# Patient Record
Sex: Female | Born: 1945
Health system: Southern US, Community
[De-identification: ages and names within clinical notes are randomized; demographics above are authoritative.]

## PROBLEM LIST (undated history)

## (undated) DIAGNOSIS — F329 Major depressive disorder, single episode, unspecified: Secondary | ICD-10-CM

## (undated) DIAGNOSIS — E079 Disorder of thyroid, unspecified: Secondary | ICD-10-CM

## (undated) DIAGNOSIS — C4491 Basal cell carcinoma of skin, unspecified: Secondary | ICD-10-CM

## (undated) DIAGNOSIS — Z780 Asymptomatic menopausal state: Secondary | ICD-10-CM

## (undated) DIAGNOSIS — F32A Depression, unspecified: Secondary | ICD-10-CM

## (undated) DIAGNOSIS — E785 Hyperlipidemia, unspecified: Secondary | ICD-10-CM

## (undated) HISTORY — DX: Asymptomatic menopausal state: Z78.0

## (undated) HISTORY — DX: Hyperlipidemia, unspecified: E78.5

## (undated) HISTORY — DX: Disorder of thyroid, unspecified: E07.9

## (undated) HISTORY — DX: Depression, unspecified: F32.A

## (undated) HISTORY — PX: ABDOMINAL HYSTERECTOMY: SHX81

## (undated) HISTORY — DX: Major depressive disorder, single episode, unspecified: F32.9

---
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
## 1999-11-08 ENCOUNTER — Encounter: Payer: Self-pay | Admitting: Family Medicine

## 1999-11-08 ENCOUNTER — Encounter: Admission: RE | Admit: 1999-11-08 | Discharge: 1999-11-08 | Payer: Self-pay | Admitting: Family Medicine

## 2000-11-08 ENCOUNTER — Encounter: Payer: Self-pay | Admitting: Family Medicine

## 2000-11-08 ENCOUNTER — Encounter: Admission: RE | Admit: 2000-11-08 | Discharge: 2000-11-08 | Payer: Self-pay | Admitting: Family Medicine

## 2001-11-26 ENCOUNTER — Encounter: Payer: Self-pay | Admitting: Family Medicine

## 2001-11-26 ENCOUNTER — Encounter: Admission: RE | Admit: 2001-11-26 | Discharge: 2001-11-26 | Payer: Self-pay | Admitting: Family Medicine

## 2001-11-28 ENCOUNTER — Encounter: Admission: RE | Admit: 2001-11-28 | Discharge: 2001-11-28 | Payer: Self-pay | Admitting: Family Medicine

## 2001-11-28 ENCOUNTER — Encounter: Payer: Self-pay | Admitting: Family Medicine

## 2002-12-15 ENCOUNTER — Encounter: Payer: Self-pay | Admitting: Family Medicine

## 2002-12-15 ENCOUNTER — Encounter: Admission: RE | Admit: 2002-12-15 | Discharge: 2002-12-15 | Payer: Self-pay | Admitting: Family Medicine

## 2003-12-22 ENCOUNTER — Encounter: Admission: RE | Admit: 2003-12-22 | Discharge: 2003-12-22 | Payer: Self-pay | Admitting: Physician Assistant

## 2005-01-13 ENCOUNTER — Encounter: Admission: RE | Admit: 2005-01-13 | Discharge: 2005-01-13 | Payer: Self-pay | Admitting: Physician Assistant

## 2006-01-15 ENCOUNTER — Encounter: Admission: RE | Admit: 2006-01-15 | Discharge: 2006-01-15 | Payer: Self-pay | Admitting: Family Medicine

## 2006-08-22 ENCOUNTER — Encounter: Admission: RE | Admit: 2006-08-22 | Discharge: 2006-08-22 | Payer: Self-pay | Admitting: Family Medicine

## 2007-01-18 ENCOUNTER — Encounter: Admission: RE | Admit: 2007-01-18 | Discharge: 2007-01-18 | Payer: Self-pay | Admitting: Family Medicine

## 2007-01-30 ENCOUNTER — Encounter: Payer: Self-pay | Admitting: Obstetrics & Gynecology

## 2007-01-30 ENCOUNTER — Ambulatory Visit: Payer: Self-pay | Admitting: Obstetrics & Gynecology

## 2007-02-01 ENCOUNTER — Ambulatory Visit: Payer: Self-pay | Admitting: Obstetrics & Gynecology

## 2007-02-22 ENCOUNTER — Ambulatory Visit: Payer: Self-pay | Admitting: Obstetrics & Gynecology

## 2007-10-03 ENCOUNTER — Encounter: Admission: RE | Admit: 2007-10-03 | Discharge: 2007-10-03 | Payer: Self-pay | Admitting: Obstetrics & Gynecology

## 2008-01-21 ENCOUNTER — Encounter: Admission: RE | Admit: 2008-01-21 | Discharge: 2008-01-21 | Payer: Self-pay | Admitting: Obstetrics & Gynecology

## 2008-02-12 ENCOUNTER — Ambulatory Visit: Payer: Self-pay | Admitting: Obstetrics & Gynecology

## 2008-02-12 ENCOUNTER — Encounter: Payer: Self-pay | Admitting: Obstetrics & Gynecology

## 2008-02-12 LAB — CONVERTED CEMR LAB
BUN: 14 mg/dL (ref 6–23)
CO2: 23 meq/L (ref 19–32)
Calcium: 9.4 mg/dL (ref 8.4–10.5)
Chloride: 102 meq/L (ref 96–112)
Cholesterol: 249 mg/dL — ABNORMAL HIGH (ref 0–200)
Creatinine, Ser: 0.64 mg/dL (ref 0.40–1.20)
Glucose, Bld: 91 mg/dL (ref 70–99)
HCT: 38.9 % (ref 36.0–46.0)
HDL: 53 mg/dL (ref >39)
Hemoglobin: 12.9 g/dL (ref 12.0–15.0)
LDL Cholesterol: 160 mg/dL — ABNORMAL HIGH (ref 0–99)
MCHC: 33.2 g/dL (ref 30.0–36.0)
MCV: 92.6 fL (ref 78.0–100.0)
Platelets: 266 10*3/uL (ref 150–400)
Potassium: 4.4 meq/L (ref 3.5–5.3)
RBC: 4.2 M/uL (ref 3.87–5.11)
RDW: 12.4 % (ref 11.5–15.5)
Sodium: 138 meq/L (ref 135–145)
TSH: 4.93 microintl units/mL — ABNORMAL HIGH (ref 0.350–4.50)
Total CHOL/HDL Ratio: 4.7
Triglycerides: 179 mg/dL — ABNORMAL HIGH (ref <150)
VLDL: 36 mg/dL (ref 0–40)
WBC: 5.5 10*3/uL (ref 4.0–10.5)

## 2008-02-24 ENCOUNTER — Ambulatory Visit: Payer: Self-pay | Admitting: Obstetrics & Gynecology

## 2009-03-02 ENCOUNTER — Encounter: Admission: RE | Admit: 2009-03-02 | Discharge: 2009-03-02 | Payer: Self-pay | Admitting: Surgery

## 2009-03-02 ENCOUNTER — Ambulatory Visit: Payer: Self-pay | Admitting: Obstetrics & Gynecology

## 2009-03-03 ENCOUNTER — Encounter: Payer: Self-pay | Admitting: Obstetrics & Gynecology

## 2009-03-03 LAB — CONVERTED CEMR LAB
Calcium: 9.6 mg/dL (ref 8.4–10.5)
Cholesterol: 230 mg/dL — ABNORMAL HIGH (ref 0–200)
LDL Cholesterol: 148 mg/dL — ABNORMAL HIGH (ref 0–99)
Potassium: 4.7 meq/L (ref 3.5–5.3)
Sodium: 137 meq/L (ref 135–145)
TSH: 2.493 microintl units/mL (ref 0.350–4.500)
Total CHOL/HDL Ratio: 3.9
VLDL: 23 mg/dL (ref 0–40)

## 2009-03-16 ENCOUNTER — Ambulatory Visit: Payer: Self-pay | Admitting: Family Medicine

## 2009-03-16 DIAGNOSIS — R42 Dizziness and giddiness: Secondary | ICD-10-CM

## 2010-03-15 ENCOUNTER — Encounter
Admission: RE | Admit: 2010-03-15 | Discharge: 2010-03-15 | Payer: Self-pay | Source: Home / Self Care | Attending: Obstetrics & Gynecology | Admitting: Obstetrics & Gynecology

## 2010-04-12 NOTE — Assessment & Plan Note (Signed)
Summary: V&D/TJ   Vital Signs:  Patient Profile:   65 Years Old Female CC:      Vomiting & diarrhea, dizzy x 2 days Height:     68 inches Weight:      272 pounds O2 Sat:      97 % O2 treatment:    Room Air Temp:     97.2 degrees F oral Pulse rate:   91 / minute Pulse rhythm:   regular Resp:     16 per minute BP sitting:   115 / 80  (right arm) Cuff size:   regular  Vitals Entered By: Emilio Math (March 16, 2009 3:20 PM)                  Current Allergies: ! PENICILLINHistory of Present Illness Chief Complaint: Vomiting & diarrhea, dizzy x 2 days History of Present Illness: Subjective:  Patient complains of developing nausea/vomiting and diarrhea yesterday which improved, then today she awoke with dizziness (sensation of being off-balance).  She had a headache yesterday, resolved.  No fevers, chills, and sweats.  No respiratory or GU symptoms.  No localizing neuro symptoms   Current Meds SYNTHROID 25 MCG TABS (LEVOTHYROXINE SODIUM)  BAYER CHILDRENS ASPIRIN 81 MG CHEW (ASPIRIN)  MECLIZINE HCL 25 MG TABS (MECLIZINE HCL) One by mouth two times a day to three times a day as needed for dizziness  REVIEW OF SYSTEMS Constitutional Symptoms      Denies fever, chills, night sweats, weight loss, weight gain, and fatigue.  Eyes       Denies change in vision, eye pain, eye discharge, glasses, contact lenses, and eye surgery. Ear/Nose/Throat/Mouth       Denies hearing loss/aids, change in hearing, ear pain, ear discharge, dizziness, frequent runny nose, frequent nose bleeds, sinus problems, sore throat, hoarseness, and tooth pain or bleeding.  Respiratory       Denies dry cough, productive cough, wheezing, shortness of breath, asthma, bronchitis, and emphysema/COPD.  Cardiovascular       Denies murmurs, chest pain, and tires easily with exhertion.    Gastrointestinal       Complains of stomach pain, nausea/vomiting, and diarrhea.      Denies constipation, blood in bowel  movements, and indigestion. Genitourniary       Denies painful urination, kidney stones, and loss of urinary control. Neurological       Complains of headaches and weakness.      Denies loss of or changes in sensation, numbness, tngling, tremors, paralysis, seizures, and fainting/blackouts. Musculoskeletal       Denies muscle pain, joint pain, joint stiffness, decreased range of motion, redness, swelling, muscle weakness, and gout.  Skin       Denies bruising, unusual mles/lumps or sores, and hair/skin or nail changes.  Psych       Denies mood changes, temper/anger issues, anxiety/stress, speech problems, depression, and sleep problems.  Past History:  Past Medical History: Unremarkable  Past Surgical History: Denies surgical history  Family History: Mother, D, Heart Father, D, Heart  Social History: Non-smoker ETOH-yes No [employer], [employer]   Objective: Appearance:  Patient appears uncomfortable but in no distress.  She is alert and oriented  Eyes:  Pupils are equal, round, and reactive to light and accomdation.  Extraocular movement is intact.  Conjunctivae are not inflamed.  Fundi benign Ears:  Canals normal.  Tympanic membranes normal.   Nose:  Normal septum.  Normal turbinates, mildly congested.   No sinus  tenderness present.  Mouth:  moist mucous membranes  Neck:  Supple.  No adenopathy is present.   Lungs:  Clear to auscultation.  Breath sounds are equal.  Heart:  Regular rate and rhythm without murmurs, rubs, or gallops.  Abdomen:  Nontender without masses or hepatosplenomegaly.  Bowel sounds are present.  No CVA or flank tenderness.  Neurologic:  Cranial nerves 2 through 12 are normal.  Patellar reflexes are normal.  Cerebellar function is intact.    Assessment New Problems: VERTIGO (ICD-780.4)  Suspect early symptoms of a viral illness  Plan New Medications/Changes: MECLIZINE HCL 25 MG TABS (MECLIZINE HCL) One by mouth two times a day to  three times a day as needed for dizziness  #15 x 1, 03/16/2009, Donna Christen MD  New Orders: Est. Patient Level III [04540] Promethazine up to 50mg  [J2550] Admin of Therapeutic Inj  intramuscular or subcutaneous [96372] Promethazine up to 50mg  [J2550] Planning Comments:   Promethazine 25mg  IM Clear liquids today, then advance.  Rx for meclizine If symptoms become significantly worse during the night  proceed to the local emergency room.  Follow-up with PCP if not improving 2 to 3 days.   The patient and/or caregiver has been counseled thoroughly with regard to medications prescribed including dosage, schedule, interactions, rationale for use, and possible side effects and they verbalize understanding.  Diagnoses and expected course of recovery discussed and will return if not improved as expected or if the condition worsens. Patient and/or caregiver verbalized understanding.  Prescriptions: MECLIZINE HCL 25 MG TABS (MECLIZINE HCL) One by mouth two times a day to three times a day as needed for dizziness  #15 x 1   Entered and Authorized by:   Donna Christen MD   Signed by:   Donna Christen MD on 03/16/2009   Method used:   Print then Give to Patient   RxID:   [id number]    Medication Administration  Injection # 1:    Medication: Promethazine up to 50mg     Diagnosis: VERTIGO (ICD-780.4)    Route: IM    Site: LUOQ gluteus    Exp Date: 02/10/2010    Lot #: [id number]    Mfr: Baxter    Comments: 25mg s given    Patient tolerated injection without complications    Given by: Emilio Math (March 16, 2009 4:08 PM)  Orders Added: 1)  Est. Patient Level III [46962] 2)  Promethazine up to 50mg  [J2550] 3)  Admin of Therapeutic Inj  intramuscular or subcutaneous [96372] 4)  Promethazine up to 50mg  [J2550]

## 2010-04-12 NOTE — Letter (Signed)
Summary: Handout Printed  Printed Handout:  - Clear Liquid Diet-Brief 

## 2010-06-13 ENCOUNTER — Other Ambulatory Visit: Payer: Self-pay

## 2010-06-13 ENCOUNTER — Ambulatory Visit: Payer: 59 | Admitting: Internal Medicine

## 2010-06-13 DIAGNOSIS — Z113 Encounter for screening for infections with a predominantly sexual mode of transmission: Secondary | ICD-10-CM

## 2010-06-13 DIAGNOSIS — E039 Hypothyroidism, unspecified: Secondary | ICD-10-CM

## 2010-06-13 DIAGNOSIS — R319 Hematuria, unspecified: Secondary | ICD-10-CM

## 2010-06-13 DIAGNOSIS — R4589 Other symptoms and signs involving emotional state: Secondary | ICD-10-CM

## 2010-06-13 DIAGNOSIS — Z1272 Encounter for screening for malignant neoplasm of vagina: Secondary | ICD-10-CM

## 2010-06-13 DIAGNOSIS — Z01419 Encounter for gynecological examination (general) (routine) without abnormal findings: Secondary | ICD-10-CM

## 2010-06-21 ENCOUNTER — Ambulatory Visit: Payer: 59 | Attending: Internal Medicine | Admitting: Physical Therapy

## 2010-07-13 ENCOUNTER — Ambulatory Visit: Payer: 59 | Admitting: Internal Medicine

## 2010-07-26 NOTE — Assessment & Plan Note (Signed)
NAMEJEMILA, [name] NO.:  [id number]   MEDICAL RECORD NO.:  [id number]          PATIENT TYPE:  POB   LOCATION:  [facility] [place]         FACILITY:  [facility]   PHYSICIAN:  Elsie Lincoln, MD      DATE OF BIRTH:  Sep 28, 1945   DATE OF SERVICE:                                  CLINIC NOTE   The patient is a 65 year old nulliparous female who presents for GYN  exam.  She had a hysterectomy in 1991 for fibroids.  She has never had  an abnormal Pap smear but desires Pap smears every year.  We will comply  with that today.  She did have her ovaries left behind but she has been  in menopause for probably about 10 years.  She had a bone mineral  density test about 10 years ago and would like another one.  She is  scheduled for a colonoscopy at the beginning of the year.  She has never  had one of these before.  She is also having fasting labs done this  Friday.  We are doing a urinalysis today because she does have leakage  of urine.  It is not severe, however, it does occur __________ to make  sure that she does not have a urinary tract infection.  She has never  had any children.  The patient is not sexually active.  She lives alone.   PAST MEDICAL HISTORY:  She has hypothyroidism.   PAST SURGICAL HISTORY:  Hysterectomy in 1991.   GYN HISTORY:  No abnormal Pap smears.  Positive history of fibroids  requiring hysterectomy.  No ovarian cysts.  No STDs.   OBSTETRICAL HISTORY:  G-0, P-0.   FAMILY HISTORY:  Mother and father had diabetes, heart attacks, heart  disease, high blood pressure.  Her mother has had some type of cancer in  the past but I cannot read on her intake sheet what type of cancer it  is.   MEDICATIONS:  Thyroid medication, Citalopram, and B12.   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   REVIEW OF SYSTEMS:  Positive for weight gain, a loss of smelling, but  she has seen the ENT for this.  She is  near-sighted.  Loss of urine with  coughing or sneezing and hot flashes.  The black cohosh she takes helps  her hot flashes.   ALLERGIES:  None to medications.   PHYSICAL EXAMINATION:  GENERAL:  Well-nourished, well-developed, no  apparent distress.  HEENT:  Normocephalic, atraumatic.  Good dentition.  NECK:  Thyroid:  No masses.  LUNGS:  Clear to auscultation bilaterally.  HEART:  Regular rate and rhythm.  BREASTS:  No masses, nontender, no lymphadenopathy, no nipple discharge.  ABDOMEN:  Soft, nontender.  No organomegaly, no hernia.  PELVIC: Genitalia Tanner  V.  Vagina atrophic.  Urethra:  No prolapse.  Bladder small cystocele.  Vagina good length.  Uterus surgically absent.  Adnexa:  No masses, nontender.  Rectovaginal:  No __________.  EXTREMITIES:  Nontender.   ASSESSMENT/PLAN:  A 65 year old female for Well Woman exam.  1. Pap smear.  2.  CBC, BNP, TSH, and cholesterol panel this Friday, fasting.  3. A mammogram was done last week.  Will follow up result.      Colonoscopy next year.  4. A mineral density.  5. Return to clinic in a year or sooner if labs are abnormal.           ______________________________  Elsie Lincoln, MD     KL/MEDQ  D:  01/30/2007  T:  01/30/2007  Job:  [id number]

## 2010-07-26 NOTE — Assessment & Plan Note (Signed)
[name], [name]                 ACCOUNT NO.:  [id number]   MEDICAL RECORD NO.:  [id number]          PATIENT TYPE:  POB   LOCATION:  [facility] [place]         [facility]ACILITY:  [facility]   PHYSICIAN:  Elsie Lincoln, MD      DATE OF BIRTH:  07/04/1945   DATE OF SERVICE:  03/02/2009                                  CLINIC NOTE   The patient is a 65 year old female who presents for yearly exam.  The  patient has no complaints.  She has hypothyroid and does sees an  endocrinologist which she does not particularly enjoy seeing.  She wants  to know we can manage it.  I told her as long as that she is easy to  manage, I could manage her thyroid medication.  She is on 25 mcg of  levothyroxine and the doctor has not checked her level recently.  I will  check TSH tomorrow when she does her fasting blood work.  She again does  want a Pap smear even though she  has had a hysterectomy for benign  reasons.  She understands that insurance at some point may not pay and  she said she will deal without it at that time.   PAST MEDICAL HISTORY:  Hypothyroidism.   PAST SURGICAL HISTORY:  Hysterectomy.   GYNECOLOGIC HISTORY:  No history of abnormal Pap smear, status post  hysterectomy.  No ovarian cyst.  No STDs.   OBSTETRICAL HISTORY:  Para 0.   FAMILY HISTORY:  Kidney cancer.   MEDICATIONS:  Citalopram, B12, multivitamin, flaxseed oil, calcium plus  vitamin D, aspirin, D3, black cohosh, levothyroxine, and fish oil.   ALLERGIES:  None.   REVIEW OF SYSTEMS:  Negative.   PHYSICAL EXAMINATION:  VITAL SIGNS:  Pulse 78, blood pressure 124/77,  weight 173, height 68 inches.  GENERAL:  Well nourished, well developed, no apparent distress.  HEENT:  Normocephalic, atraumatic.  Good dentition.  Thyroid, no masses.  LUNGS:  Clear to auscultation bilaterally.  HEART:  Regular rate and rhythm.  BREASTS:  No masses, nontender.  No lymphadenopathy.  No nipple  discharge.  ABDOMEN:  Soft, nontender.  No  organomegaly.  No hernia.  PELVIC:  Genitalia, Tanner V.  Vagina atrophic.  Vaginal cuff intact.  Bladder nontender.  Adnexa, no masses.  Uterus surgically absent.  RECTAL:  No hemorrhoids, rectovaginal masses.  EXTREMITIES:  Nontender.   ASSESSMENT AND PLAN:  A 65 year old well-woman for well-woman exam with  well-woman exam;  1. Pap smear.  2. Check TSH, basic metabolic panel and lipid panel.  The patient had      high cholesterol last year, but does not followup with her family      practice doctor.  We will check it again and refer her on if it is      still elevated.  The patient states she did follow low-cholesterol      diet.  3. The patient had normal bone density test last year.  We will not      need another one for several more years.  4. The patient had normal colonoscopy in 2009.  ______________________________  Elsie Lincoln, MD     KL/MEDQ  D:  03/02/2009  T:  03/03/2009  Job:  [id number]

## 2010-07-26 NOTE — Assessment & Plan Note (Signed)
NAMEMIIA, BLANKS                 ACCOUNT NO.:  [id number]   MEDICAL RECORD NO.:  [id number]          PATIENT TYPE:  POB   LOCATION:  [facility] [place]         FACILITY:  [facility]   PHYSICIAN:  Elsie Lincoln, MD      DATE OF BIRTH:  March 27, 1945   DATE OF SERVICE:                                  CLINIC NOTE   The patient is a 65 year old female who presents for her well-woman  exam.  The patient has no problems.  She is menopausal and has had a  hysterectomy 20 years ago.  Of note, her doctor took her off her thyroid  medicine, so TSH was 0.238 last year.  We are going to redraw it today.  The patient does self-breast exams, wears sunscreen, just got a  colonoscopy which was normal.  Her bone mineral density was normal.  She  exercises 3 times a week, does not smoke, drink, or do drugs although  occasional glass of wine.  The patient flosses twice a day.  The patient  has had a hysterectomy for benign reasons, but still desires to have a  Pap smear, we will do that today.   PAST MEDICAL HISTORY:  Hypothyroidism.   PAST SURGICAL HISTORY:  Hysterectomy in 1991.   GYN HISTORY:  No abnormal Pap smears, status post hysterectomy, no  ovarian cysts, no sexually transmitted diseases.   OBSTETRICAL HISTORY:  Para 0.   FAMILY HISTORY:  I did find out that her mother had kidney cancer and  died at age 73.   MEDICATIONS:  Citalopram, B12, multivitamin, flaxseed oil, calcium plus  vitamin D.   ALLERGIES:  None.   REVIEW OF SYSTEMS:  Negative.   PHYSICAL EXAMINATION:  VITAL SIGNS:  Pulse 76, blood pressure 124/80,  weight 182, height 68 inches.  GENERAL:  Well nourished, well developed in no apparent distress.  HEENT:  Normocephalic, atraumatic.  Thyroid no masses.  LUNGS:  Clear to auscultation bilaterally.  HEART:  Regular rate and rhythm.  BREASTS:  No masses.  No lymphadenopathy.  No nipple changes or so  discharge.  ABDOMEN:  Soft, nontender.  No organomegaly.  No hernia.  No  lymphadenopathy.  PELVIC:  Genitalia Tanner V.  Vagina appears atrophic, but not  symptomatic.  Vaginal cuff is intact. On bimanual, there were no masses  and nontender.  Rectovaginal no masses, no nodularity.  EXTREMITIES:  Nontender.  No edema.   ASSESSMENT AND PLAN:  A 65 year old well woman.  1. Pap smear.  2. Mammogram has already been done this year and is within normal      limits.  The patient needs a bone mineral density in 5 years,      colonoscopy in 10 years.  3. The patient has already received her influenza vaccine, but is      interested in the zoster vaccine which we will order for her.  The      patient should come back in a year, otherwise.           ______________________________  Elsie Lincoln, MD     KL/MEDQ  D:  02/12/2008  T:  02/13/2008  Job:  [id number]

## 2010-10-05 ENCOUNTER — Telehealth: Payer: Self-pay | Admitting: Internal Medicine

## 2010-10-05 NOTE — Telephone Encounter (Signed)
Pt would like a referral for Bone Density to be scheduled at [facility] in [place].  Pt will call to schedule Bone Density when referral is sent.  Please call when referral sent.

## 2010-10-05 NOTE — Telephone Encounter (Signed)
Per chart, looks like pt's last bone density was done 12/10.  OK to order repeat scan?

## 2010-10-06 NOTE — Telephone Encounter (Signed)
Call pt.  She is not due until 2 years since last exam.  If none in 12/10  Have her call back in December to schedule another

## 2010-10-06 NOTE — Telephone Encounter (Signed)
LMOVM for pt to return call to the ofc, need clarification on when pt had her last bone density- needs to be 2 years between scans

## 2010-10-07 ENCOUNTER — Encounter: Payer: Self-pay | Admitting: Emergency Medicine

## 2010-10-14 ENCOUNTER — Telehealth: Payer: Self-pay | Admitting: Internal Medicine

## 2010-10-14 NOTE — Telephone Encounter (Signed)
Pt called about lab urine culture done 06/13/2010 at CPE visit.  She received a bill for $7.90 from [employer].  Stated it should be covered under her wellness coverage.  Stated coding should be changed to wellness exam.  Also

## 2010-10-17 ENCOUNTER — Telehealth: Payer: Self-pay | Admitting: Emergency Medicine

## 2010-10-17 DIAGNOSIS — Z13820 Encounter for screening for osteoporosis: Secondary | ICD-10-CM

## 2010-10-17 DIAGNOSIS — Z780 Asymptomatic menopausal state: Secondary | ICD-10-CM

## 2010-10-17 NOTE — Telephone Encounter (Signed)
Pt called Friday 10/14/10 with question regarding balance billed as a result of urine culture done 4/12.  LMOVM at home phone number to try to answer questions-dhp, rn

## 2010-10-24 NOTE — Telephone Encounter (Signed)
Spoke with pt.  She states that she has received two bills from [facility].  She states she is not required to pay anything for wellness labs.  One bill was for urine culture for $7.90.  I explained that urine culture would not fall under wellness because she was found to have blood in her urine on dipstick in the office, that is why culture was sent.  She is agreeable to pay that bill.  Other bill was for labs run on same day.  She states bill is for $71.13.  Labs were not filled with wellness diagnosis, so they did not fall under wellness benefit of her insurance plan.  Can this be adjusted?  Also, her last bone density scan was done in 09/2007.  She would like to have repeated.  Okay to order at [facility] in [place]?  Will call patient with dates of last tetanus and zoster immunizations given at [facility]

## 2010-10-24 NOTE — Telephone Encounter (Signed)
OK to charge for preventive visit for labs  thanks

## 2010-10-24 NOTE — Telephone Encounter (Signed)
LMOVM for nurse for Primus Bravo (NP) at [facility] to get date of vaccination for tetanus and zoster.  Order entered for [facility] for bone density. Pt aware she can call at her convenience to schd (her request)  Spoke with Ebony in billing at [facility].  She states at this time, pt does not have a balance.  Claim from 06/13/10 was refiled Friday 10/21/10 to [employer].  Will await their reply before adding anything additional to claim.  If needed, can add code v72.31 to claim so that labs will fall under wellness benefit.  Pt is aware, per Karel Jarvis, she has $0 balance and does not need to pay either of her current bills.  She will call me if I need to add v- code to labs.

## 2010-11-02 NOTE — Telephone Encounter (Signed)
Spoke with Thurston Hole at [facility]

## 2010-11-02 NOTE — Telephone Encounter (Signed)
Spoke with Thurston Hole at [facility].  Per Thurston Hole- pt had Td in 2006, no record of zoster vaccine.  LMOVM for pt with same information.

## 2011-01-11 ENCOUNTER — Other Ambulatory Visit: Payer: 59

## 2011-02-07 ENCOUNTER — Ambulatory Visit
Admission: RE | Admit: 2011-02-07 | Discharge: 2011-02-07 | Disposition: A | Discharge status: Home / Self Care | Payer: 59 | Source: Ambulatory Visit | Attending: Internal Medicine | Admitting: Internal Medicine

## 2011-02-16 ENCOUNTER — Encounter: Payer: Self-pay | Admitting: Emergency Medicine

## 2011-06-20 ENCOUNTER — Telehealth: Payer: Self-pay | Admitting: Internal Medicine

## 2011-06-20 ENCOUNTER — Encounter: Payer: Self-pay | Admitting: Internal Medicine

## 2011-06-20 DIAGNOSIS — F419 Anxiety disorder, unspecified: Secondary | ICD-10-CM | POA: Insufficient documentation

## 2011-06-20 DIAGNOSIS — E039 Hypothyroidism, unspecified: Secondary | ICD-10-CM | POA: Insufficient documentation

## 2011-06-20 DIAGNOSIS — E785 Hyperlipidemia, unspecified: Secondary | ICD-10-CM | POA: Insufficient documentation

## 2011-06-20 NOTE — Telephone Encounter (Signed)
Left message on voicemail for pt to return call to the office to schedule appt

## 2011-06-20 NOTE — Telephone Encounter (Signed)
[name],  Call this pt and let her know lthat I will refill 30 days worth of meds.  She has not been seen in a year and was supposed to follow up with me one month after her CPE  Which was on 06/13/2010  I haven't seen her since.    Give her an appt an d message back with appt date  Thanks

## 2011-06-22 ENCOUNTER — Encounter: Payer: Self-pay | Admitting: Emergency Medicine

## 2011-06-22 NOTE — Telephone Encounter (Signed)
I left pt a message, I have not heard back from her, so I mailed her a letter today

## 2011-07-11 ENCOUNTER — Other Ambulatory Visit: Payer: Self-pay | Admitting: Obstetrics and Gynecology

## 2011-07-11 DIAGNOSIS — Z1231 Encounter for screening mammogram for malignant neoplasm of breast: Secondary | ICD-10-CM

## 2011-07-14 ENCOUNTER — Other Ambulatory Visit: Payer: Self-pay | Admitting: Internal Medicine

## 2011-07-27 ENCOUNTER — Other Ambulatory Visit: Payer: Self-pay | Admitting: Internal Medicine

## 2011-07-27 NOTE — Telephone Encounter (Signed)
[name]    Call pt and  Let her know that I sent a one month supply to her pharmacy for her thyroid.  She has not seen me since 06/2010.  She needs to schedule office visit and have her thyroid blood levels checked once a year.  Ok to give appt

## 2011-07-31 ENCOUNTER — Encounter: Payer: Self-pay | Admitting: Internal Medicine

## 2011-07-31 ENCOUNTER — Other Ambulatory Visit: Payer: Self-pay | Admitting: Internal Medicine

## 2011-07-31 ENCOUNTER — Ambulatory Visit (INDEPENDENT_AMBULATORY_CARE_PROVIDER_SITE_OTHER): Payer: 59 | Admitting: Internal Medicine

## 2011-07-31 VITALS — BP 128/80 | HR 65 | Temp 98.5°F | Ht 67.25 in | Wt 181.0 lb

## 2011-07-31 DIAGNOSIS — R319 Hematuria, unspecified: Secondary | ICD-10-CM

## 2011-07-31 DIAGNOSIS — E039 Hypothyroidism, unspecified: Secondary | ICD-10-CM

## 2011-07-31 DIAGNOSIS — Z8659 Personal history of other mental and behavioral disorders: Secondary | ICD-10-CM | POA: Insufficient documentation

## 2011-07-31 DIAGNOSIS — Z90710 Acquired absence of both cervix and uterus: Secondary | ICD-10-CM

## 2011-07-31 DIAGNOSIS — E785 Hyperlipidemia, unspecified: Secondary | ICD-10-CM

## 2011-07-31 DIAGNOSIS — N63 Unspecified lump in unspecified breast: Secondary | ICD-10-CM

## 2011-07-31 DIAGNOSIS — Z124 Encounter for screening for malignant neoplasm of cervix: Secondary | ICD-10-CM

## 2011-07-31 DIAGNOSIS — Z0000 Encounter for general adult medical examination without abnormal findings: Secondary | ICD-10-CM

## 2011-07-31 HISTORY — DX: Acquired absence of both cervix and uterus: Z90.710

## 2011-07-31 LAB — COMPREHENSIVE METABOLIC PANEL
ALT: 15 U/L (ref 0–35)
BUN: 13 mg/dL (ref 6–23)
Calcium: 9.5 mg/dL (ref 8.4–10.5)
Glucose, Bld: 88 mg/dL (ref 70–99)
Potassium: 4.8 mEq/L (ref 3.5–5.3)

## 2011-07-31 LAB — POCT URINALYSIS DIPSTICK
Ketones, UA: NEGATIVE
Nitrite, UA: NEGATIVE
Protein, UA: NEGATIVE
Spec Grav, UA: >=1.03
pH, UA: 6.5

## 2011-07-31 LAB — CBC WITH DIFFERENTIAL/PLATELET
Basophils Absolute: 0 10*3/uL (ref 0.0–0.1)
Eosinophils Absolute: 0.2 10*3/uL (ref 0.0–0.7)
HCT: 39 % (ref 36.0–46.0)
Hemoglobin: 12.9 g/dL (ref 12.0–15.0)
Lymphocytes Relative: 35 % (ref 12–46)
MCH: 30.4 pg (ref 26.0–34.0)
MCHC: 33.1 g/dL (ref 30.0–36.0)
Neutrophils Relative %: 54 % (ref 43–77)
Platelets: 251 10*3/uL (ref 150–400)
WBC: 5.3 10*3/uL (ref 4.0–10.5)

## 2011-07-31 LAB — TSH: TSH: 2.498 u[IU]/mL (ref 0.350–4.500)

## 2011-07-31 LAB — LIPID PANEL
HDL: 51 mg/dL (ref >39)
LDL Cholesterol: 121 mg/dL — ABNORMAL HIGH (ref 0–99)
VLDL: 22 mg/dL (ref 0–40)

## 2011-07-31 MED ORDER — LEVOTHYROXINE SODIUM 25 MCG PO TABS: 25.0000 ug | ORAL_TABLET | Freq: Every day | ORAL | Status: DC

## 2011-07-31 MED ORDER — CITALOPRAM HYDROBROMIDE 20 MG PO TABS: 20.0000 mg | ORAL_TABLET | Freq: Every day | ORAL | Status: DC

## 2011-07-31 MED ORDER — CIPROFLOXACIN HCL 500 MG PO TABS: 500.0000 mg | ORAL_TABLET | Freq: Two times a day (BID) | ORAL | Status: AC

## 2011-07-31 NOTE — Patient Instructions (Signed)
Take antibiotic as prescribed  See me in 2-3 weeks for repeat urinalysis    Labs will be mailed to you

## 2011-07-31 NOTE — Progress Notes (Signed)
Subjective:    Patient ID: [name], female    DOB: 1945-03-24, 66 y.o.   MRN: [id number]  HPI Pt is here for comprehensive eval.  She is doing well.  She denies any dizziness.  No depressed mood Celexa controlling well.  See urine  She is asymptomatic.    She is up to date on vaccines and colonoscopy.  She has mammogram scheduled for next week     Allergies  Allergen Reactions  . Penicillins    Past Medical History  Diagnosis Date  . Thyroid disease     hypothyroid  . Depression   . Hyperlipidemia   . Menopause    Past Surgical History  Procedure Date  . Abdominal hysterectomy    History   Social History  . Marital Status: Divorced    Spouse Name: N/A    Number of Children: N/A  . Years of Education: N/A   Occupational History  . Not on file.   Social History Main Topics  . Smoking status: Never Smoker   . Smokeless tobacco: Never Used  . Alcohol Use: 0.5 oz/week    1 drink(s) per week  . Drug Use: No  . Sexually Active: Not Currently   Other Topics Concern  . Not on file   Social History Narrative  . No narrative on file   Family History  Problem Relation Age of Onset  . Heart disease Mother   . Diabetes Mother   . Heart disease Father    Patient Active Problem List  Diagnoses  . VERTIGO  . Hypothyroidism  . Hyperlipidemia  . Anxiety  . History of depression  . H/O: hysterectomy   Current Outpatient Prescriptions on File Prior to Visit  Medication Sig Dispense Refill  . aspirin 81 MG EC tablet Take 81 mg by mouth daily.        . Calcium Carbonate-Vit D-Min (CALCIUM 1200 PO) Take 1 tablet by mouth daily.        . cholecalciferol (VITAMIN D) 1000 UNITS tablet Take 1,000 Units by mouth daily.        . citalopram (CELEXA) 20 MG tablet TAKE ONE AND ONE-HALF TABLETS BY MOUTH DAILY  135 tablet  0  . levothyroxine (SYNTHROID, LEVOTHROID) 25 MCG tablet TAKE ONE TABLET BY MOUTH ONCE DAILY  30 tablet  0       Review of Systems    Objective:   Physical Exam Physical Exam  Nursing note and vitals reviewed.  Constitutional: She is oriented to person, place, and time. She appears well-developed and well-nourished.  HENT:  Head: Normocephalic and atraumatic.  Right Ear: Tympanic membrane and ear canal normal. No drainage. Tympanic membrane is not injected and not erythematous.  Left Ear: Tympanic membrane and ear canal normal. No drainage. Tympanic membrane is not injected and not erythematous.  Nose: Nose normal. Right sinus exhibits no maxillary sinus tenderness and no frontal sinus tenderness. Left sinus exhibits no maxillary sinus tenderness and no frontal sinus tenderness.  Mouth/Throat: Oropharynx is clear and moist. No oral lesions. No oropharyngeal exudate.  Eyes: Conjunctivae and EOM are normal. Pupils are equal, round, and reactive to light.  Neck: Normal range of motion. Neck supple. No JVD present. Carotid bruit is not present. No mass and no thyromegaly present.  Cardiovascular: Normal rate, regular rhythm, S1 normal, S2 normal and intact distal pulses. Exam reveals no gallop and no friction rub.  No murmur heard.  Pulses:  Carotid pulses are 2+ on the right  side, and 2+ on the left side.  Dorsalis pedis pulses are 2+ on the right side, and 2+ on the left side.  No carotid bruit. No LE edema  Pulmonary/Chest: Breath sounds normal. She has no wheezes. She has no rales. She exhibits no tenderness. Breast exam:  R no nipple discharge no discrete mass no axillary adenopathy bilaterally.  L I feel fullness at 7'0cclock.  No firm mass no nipple discharge no axillary adenopathy Abdominal: Soft. Bowel sounds are normal. She exhibits no distension and no mass. There is no hepatosplenomegaly. There is no tenderness. There is no CVA tenderness.  Musculoskeletal: Normal range of motion.  No active synovitis to joints.  Lymphadenopathy:  She has no cervical adenopathy.  She has no axillary adenopathy.  Right: No inguinal and no  supraclavicular adenopathy present.  Left: No inguinal and no supraclavicular adenopathy present.  Neurological: She is alert and oriented to person, place, and time. She has normal strength and normal reflexes. She displays no tremor. No cranial nerve deficit or sensory deficit. Coordination and gait normal.  Skin: Skin is warm and dry. No rash noted. No cyanosis. Nails show no clubbing.  Psychiatric: She has a normal mood and affect. Her speech is normal and behavior is normal. Cognition and memory are normal.       Assessment & Plan:  1)  Health Maintenance  See scanned sheet.  Due for Dexa next year.  Continue calcium vitamin D. Pap of vaginal cuff today 2)  Osteopenia  See above 3)  L breast fullness  Will get diagnositc mammogram.  Pt requests [facility] 4)  Hematuria and WBC's Will emprirically treat with Cipro  Pt counseled to be sure to return in 3 weeks for follow up urinalysis and that I want to be sure that blood had cleared  .  She voices understanding 4)  Hypothyroidism  Check today 5)  Hyperlipidemia will check today  Continue fish oil

## 2011-08-01 ENCOUNTER — Encounter: Payer: Self-pay | Admitting: *Deleted

## 2011-08-01 LAB — VITAMIN D 25 HYDROXY (VIT D DEFICIENCY, FRACTURES): Vit D, 25-Hydroxy: 46 ng/mL (ref 30–89)

## 2011-08-03 ENCOUNTER — Other Ambulatory Visit: Payer: Self-pay | Admitting: Internal Medicine

## 2011-08-03 ENCOUNTER — Ambulatory Visit
Admission: RE | Admit: 2011-08-03 | Discharge: 2011-08-03 | Disposition: A | Discharge status: Home / Self Care | Payer: 59 | Source: Ambulatory Visit | Attending: Internal Medicine | Admitting: Internal Medicine

## 2011-08-03 DIAGNOSIS — N63 Unspecified lump in unspecified breast: Secondary | ICD-10-CM

## 2011-08-04 LAB — URINE CULTURE: Colony Count: >=100000

## 2011-08-09 ENCOUNTER — Encounter: Payer: Self-pay | Admitting: *Deleted

## 2011-08-09 NOTE — Progress Notes (Signed)
Letter with mammogram results mailed to pt's home address.

## 2011-08-22 ENCOUNTER — Ambulatory Visit: Payer: 59

## 2011-08-24 ENCOUNTER — Telehealth: Payer: Self-pay | Admitting: *Deleted

## 2011-08-24 DIAGNOSIS — N390 Urinary tract infection, site not specified: Secondary | ICD-10-CM

## 2011-08-24 NOTE — Telephone Encounter (Signed)
Pt called wanting results of her urinalysis that she dropped off last week.  Urine culture was not done and pt is going to drop urine off in [employer] tomorrow and it will be sent for culture with results sent to Dr Constance Goltz.

## 2011-08-24 NOTE — Telephone Encounter (Signed)
See notes

## 2011-08-25 ENCOUNTER — Telehealth: Payer: Self-pay | Admitting: *Deleted

## 2011-08-25 DIAGNOSIS — N390 Urinary tract infection, site not specified: Secondary | ICD-10-CM

## 2011-08-25 NOTE — Telephone Encounter (Signed)
Pt to stop by office today for a urinalysis to f/u from the UTI she had.

## 2011-08-26 LAB — URINALYSIS, MICROSCOPIC ONLY
Bacteria, UA: NONE SEEN
Crystals: NONE SEEN

## 2011-08-26 LAB — URINALYSIS, ROUTINE W REFLEX MICROSCOPIC
Ketones, ur: NEGATIVE mg/dL
Nitrite: NEGATIVE
Specific Gravity, Urine: <1.005 — ABNORMAL LOW (ref 1.005–1.030)
Urobilinogen, UA: 0.2 mg/dL (ref 0.0–1.0)

## 2011-08-28 ENCOUNTER — Telehealth: Payer: Self-pay | Admitting: *Deleted

## 2011-08-28 NOTE — Telephone Encounter (Signed)
Pt notified that urine culture was insignificant growth.  Informed her that if she should have any further problems to call the office.

## 2011-08-30 ENCOUNTER — Telehealth: Payer: Self-pay | Admitting: *Deleted

## 2011-08-30 NOTE — Telephone Encounter (Signed)
Pt notified of repeat UA was normal and that her mammogram should be repeated in 1 year.

## 2011-09-13 ENCOUNTER — Telehealth: Payer: Self-pay | Admitting: Internal Medicine

## 2011-09-13 DIAGNOSIS — R87811 Vaginal high risk human papillomavirus (HPV) DNA test positive: Secondary | ICD-10-CM

## 2011-09-13 NOTE — Telephone Encounter (Signed)
Spoke with pt and informed of pos HPV with negative cytology.  Counseled to be sure to get pap smear every year. She voices understanding

## 2012-01-22 ENCOUNTER — Other Ambulatory Visit: Payer: Self-pay | Admitting: *Deleted

## 2012-01-22 MED ORDER — LEVOTHYROXINE SODIUM 25 MCG PO TABS: 25.0000 ug | ORAL_TABLET | Freq: Every day | ORAL | Status: DC

## 2012-01-22 MED ORDER — CITALOPRAM HYDROBROMIDE 20 MG PO TABS: 20.0000 mg | ORAL_TABLET | Freq: Every day | ORAL | Status: DC

## 2012-01-22 NOTE — Telephone Encounter (Signed)
This pt called me for the RF thinking I was working with you.  She gets them filled at [facility] in [place].   Thanks, Your 2nd favorite nurse.Marland KitchenMarland Kitchen

## 2012-07-15 ENCOUNTER — Telehealth: Payer: Self-pay | Admitting: *Deleted

## 2012-07-15 DIAGNOSIS — Z139 Encounter for screening, unspecified: Secondary | ICD-10-CM

## 2012-07-15 DIAGNOSIS — Z90710 Acquired absence of both cervix and uterus: Secondary | ICD-10-CM

## 2012-07-15 DIAGNOSIS — R87811 Vaginal high risk human papillomavirus (HPV) DNA test positive: Secondary | ICD-10-CM

## 2012-07-15 DIAGNOSIS — E039 Hypothyroidism, unspecified: Secondary | ICD-10-CM | POA: Diagnosis not present

## 2012-07-15 DIAGNOSIS — E559 Vitamin D deficiency, unspecified: Secondary | ICD-10-CM | POA: Diagnosis not present

## 2012-07-15 DIAGNOSIS — E785 Hyperlipidemia, unspecified: Secondary | ICD-10-CM

## 2012-07-24 NOTE — Telephone Encounter (Signed)
labs

## 2012-08-05 ENCOUNTER — Other Ambulatory Visit: Payer: Self-pay | Admitting: Internal Medicine

## 2012-08-12 ENCOUNTER — Telehealth: Payer: Self-pay | Admitting: *Deleted

## 2012-08-12 MED ORDER — LEVOTHYROXINE SODIUM 25 MCG PO TABS: 25.0000 ug | ORAL_TABLET | Freq: Every day | ORAL | Status: DC

## 2012-08-12 NOTE — Telephone Encounter (Signed)
[name] called and needs a refill of Levothyroxine called into her pharmacy.  She is completely out; pharmacy gave her 2 pills. Her pharmacy needs to be changed in [place]; it needs to be [facility] in [place].

## 2012-08-12 NOTE — Telephone Encounter (Signed)
Refill request

## 2012-09-02 ENCOUNTER — Telehealth: Payer: Self-pay | Admitting: *Deleted

## 2012-09-02 NOTE — Telephone Encounter (Signed)
[name] had her CPE on 07/31/11, but she received a bill for $71.  Insurance company said that visit was not coded as a CPE, and that we need to fix code and file again.  Please call patient and let her know that this has been done.

## 2012-09-10 ENCOUNTER — Telehealth: Payer: Self-pay | Admitting: *Deleted

## 2012-09-10 NOTE — Telephone Encounter (Signed)
This charge has been corrected and resubmitted/ms

## 2012-09-10 NOTE — Telephone Encounter (Signed)
Returned pt call notifying her that billing issue had been resolved

## 2012-09-10 NOTE — Telephone Encounter (Signed)
See Heather's note 

## 2012-09-24 ENCOUNTER — Ambulatory Visit (INDEPENDENT_AMBULATORY_CARE_PROVIDER_SITE_OTHER): Payer: Medicare Other

## 2012-09-24 DIAGNOSIS — Z1231 Encounter for screening mammogram for malignant neoplasm of breast: Secondary | ICD-10-CM

## 2012-09-24 DIAGNOSIS — E039 Hypothyroidism, unspecified: Secondary | ICD-10-CM | POA: Diagnosis not present

## 2012-09-25 ENCOUNTER — Encounter: Payer: Self-pay | Admitting: *Deleted

## 2012-09-25 LAB — COMPREHENSIVE METABOLIC PANEL
Albumin: 4.5 g/dL (ref 3.5–5.2)
CO2: 28 mEq/L (ref 19–32)
Calcium: 9.7 mg/dL (ref 8.4–10.5)
Glucose, Bld: 84 mg/dL (ref 70–99)
Potassium: 4.5 mEq/L (ref 3.5–5.3)
Sodium: 141 mEq/L (ref 135–145)
Total Bilirubin: 0.9 mg/dL (ref 0.3–1.2)
Total Protein: 7 g/dL (ref 6.0–8.3)

## 2012-09-25 LAB — CBC WITH DIFFERENTIAL/PLATELET
Eosinophils Absolute: 0.2 10*3/uL (ref 0.0–0.7)
Hemoglobin: 13.4 g/dL (ref 12.0–15.0)
Lymphs Abs: 2.5 10*3/uL (ref 0.7–4.0)
MCH: 30.2 pg (ref 26.0–34.0)
Monocytes Relative: 5 % (ref 3–12)
Neutrophils Relative %: 47 % (ref 43–77)
RBC: 4.44 MIL/uL (ref 3.87–5.11)

## 2012-09-25 LAB — LIPID PANEL: Cholesterol: 242 mg/dL — ABNORMAL HIGH (ref 0–200)

## 2012-09-25 LAB — VITAMIN D 25 HYDROXY (VIT D DEFICIENCY, FRACTURES): Vit D, 25-Hydroxy: 55 ng/mL (ref 30–89)

## 2012-10-10 ENCOUNTER — Ambulatory Visit (INDEPENDENT_AMBULATORY_CARE_PROVIDER_SITE_OTHER): Payer: Medicare Other | Admitting: Internal Medicine

## 2012-10-10 ENCOUNTER — Telehealth: Payer: Self-pay | Admitting: *Deleted

## 2012-10-10 ENCOUNTER — Encounter: Payer: 59 | Admitting: Internal Medicine

## 2012-10-10 ENCOUNTER — Encounter: Payer: Self-pay | Admitting: Internal Medicine

## 2012-10-10 ENCOUNTER — Ambulatory Visit: Payer: 59 | Admitting: Internal Medicine

## 2012-10-10 ENCOUNTER — Ambulatory Visit (HOSPITAL_BASED_OUTPATIENT_CLINIC_OR_DEPARTMENT_OTHER)
Admission: RE | Admit: 2012-10-10 | Discharge: 2012-10-10 | Disposition: A | Discharge status: Home / Self Care | Payer: Medicare Other | Source: Ambulatory Visit | Attending: Internal Medicine | Admitting: Internal Medicine

## 2012-10-10 VITALS — BP 120/70 | HR 76 | Temp 97.2°F | Resp 18 | Wt 185.0 lb

## 2012-10-10 DIAGNOSIS — T85528A Displacement of other gastrointestinal prosthetic devices, implants and grafts, initial encounter: Secondary | ICD-10-CM

## 2012-10-10 DIAGNOSIS — K9413 Enterostomy malfunction: Secondary | ICD-10-CM

## 2012-10-10 DIAGNOSIS — F419 Anxiety disorder, unspecified: Secondary | ICD-10-CM

## 2012-10-10 DIAGNOSIS — M19039 Primary osteoarthritis, unspecified wrist: Secondary | ICD-10-CM | POA: Diagnosis not present

## 2012-10-10 DIAGNOSIS — M79609 Pain in unspecified limb: Secondary | ICD-10-CM | POA: Insufficient documentation

## 2012-10-10 DIAGNOSIS — S6992XA Unspecified injury of left wrist, hand and finger(s), initial encounter: Secondary | ICD-10-CM

## 2012-10-10 DIAGNOSIS — S6990XA Unspecified injury of unspecified wrist, hand and finger(s), initial encounter: Secondary | ICD-10-CM

## 2012-10-10 DIAGNOSIS — Z23 Encounter for immunization: Secondary | ICD-10-CM | POA: Diagnosis not present

## 2012-10-10 DIAGNOSIS — Z0000 Encounter for general adult medical examination without abnormal findings: Secondary | ICD-10-CM | POA: Diagnosis not present

## 2012-10-10 DIAGNOSIS — E039 Hypothyroidism, unspecified: Secondary | ICD-10-CM

## 2012-10-10 DIAGNOSIS — Z1272 Encounter for screening for malignant neoplasm of vagina: Secondary | ICD-10-CM | POA: Diagnosis not present

## 2012-10-10 DIAGNOSIS — F411 Generalized anxiety disorder: Secondary | ICD-10-CM | POA: Diagnosis not present

## 2012-10-10 DIAGNOSIS — Z139 Encounter for screening, unspecified: Secondary | ICD-10-CM

## 2012-10-10 DIAGNOSIS — E785 Hyperlipidemia, unspecified: Secondary | ICD-10-CM | POA: Diagnosis not present

## 2012-10-10 DIAGNOSIS — R87811 Vaginal high risk human papillomavirus (HPV) DNA test positive: Secondary | ICD-10-CM

## 2012-10-10 DIAGNOSIS — K9403 Colostomy malfunction: Secondary | ICD-10-CM

## 2012-10-10 NOTE — Progress Notes (Signed)
Subjective:    Patient ID: [name], female    DOB: [birth year], 67 y.o.   MRN: [id number]  HPI  Liborio Nixon is here for CPE  See pap.  Her vaginal smear in 2012 was neg for HPV,  HPV positive in 2013.  She is S/P hysterectomy and tells me she has not been sexually active for years.    See lipids.  She repeartedly states she does not want any prescription meds for this  She fell several months ago and landed on L wrist.  She did not seek medical attention at that time but still has some residual swelling.  She is wondering if she fractured anything  Allergies  Allergen Reactions  . Penicillins    Past Medical History  Diagnosis Date  . Thyroid disease     hypothyroid  . Depression   . Hyperlipidemia   . Menopause    Past Surgical History  Procedure Laterality Date  . Abdominal hysterectomy     History   Social History  . Marital Status: Divorced    Spouse Name: N/A    Number of Children: N/A  . Years of Education: N/A   Occupational History  . Not on file.   Social History Main Topics  . Smoking status: Never Smoker   . Smokeless tobacco: Never Used  . Alcohol Use: 0.5 oz/week    1 drink(s) per week  . Drug Use: No  . Sexually Active: Not Currently   Other Topics Concern  . Not on file   Social History Narrative  . No narrative on file   Family History  Problem Relation Age of Onset  . Heart disease Mother   . Diabetes Mother   . Heart disease Father    Patient Active Problem List   Diagnosis Date Noted  . Vaginal high risk HPV DNA test positive 09/13/2011  . History of depression 07/31/2011  . H/O: hysterectomy 07/31/2011  . Hypothyroidism 06/20/2011  . Hyperlipidemia 06/20/2011  . Anxiety 06/20/2011  . VERTIGO 03/16/2009   Current Outpatient Prescriptions on File Prior to Visit  Medication Sig Dispense Refill  . aspirin 81 MG EC tablet Take 81 mg by mouth daily.        . Calcium Carbonate-Vit D-Min (CALCIUM 1200 PO) Take 1 tablet by mouth  daily.        . cholecalciferol (VITAMIN D) 1000 UNITS tablet Take 1,000 Units by mouth daily.        . citalopram (CELEXA) 20 MG tablet Take 1 tablet (20 mg total) by mouth daily.  135 tablet  1  . fish oil-omega-3 fatty acids 1000 MG capsule Take 1 capsule by mouth daily.      . Flaxseed, Linseed, (FLAX SEED OIL) 1000 MG CAPS Take 1 capsule by mouth daily.      Marland Kitchen GARLIC PO Take 1 capsule by mouth.      . levothyroxine (SYNTHROID, LEVOTHROID) 25 MCG tablet Take 1 tablet (25 mcg total) by mouth daily.  90 tablet  0  . Multiple Vitamin (MULTIVITAMIN) tablet Take 1 tablet by mouth daily.       No current facility-administered medications on file prior to visit.     Review of Systems See HPI    Objective:   Physical Exam  Physical Exam  Vital signs and nursing note reviewed  Constitutional: She is oriented to person, place, and time. She appears well-developed and well-nourished. She is cooperative.  HENT:  Head: Normocephalic and atraumatic.  Right Ear:  Tympanic membrane normal.  Left Ear: Tympanic membrane normal.  Nose: Nose normal.  Mouth/Throat: Oropharynx is clear and moist and mucous membranes are normal. No oropharyngeal exudate or posterior oropharyngeal erythema.  Eyes: Conjunctivae and EOM are normal. Pupils are equal, round, and reactive to light.  Neck: Neck supple. No JVD present. Carotid bruit is not present. No mass and no thyromegaly present.  Cardiovascular: Regular rhythm, normal heart sounds, intact distal pulses and normal pulses.  Exam reveals no gallop and no friction rub.   No murmur heard. Pulses:      Dorsalis pedis pulses are 2+ on the right side, and 2+ on the left side.  Pulmonary/Chest: Breath sounds normal. She has no wheezes. She has no rhonchi. She has no rales. Right breast exhibits no mass, no nipple discharge and no skin change. Left breast exhibits no mass, no nipple discharge and no skin change.  Abdominal: Soft. Bowel sounds are normal. She  exhibits no distension and no mass. There is no hepatosplenomegaly. There is no tenderness. There is no CVA tenderness.  Genitourinary: Rectum normal, vagina normal and uterus normal. Rectal exam shows no mass. Guaiac negative stool. No labial fusion. There is no lesion on the right labia. There is no lesion on the left labia. Cervix exhibits no motion tenderness. Right adnexum displays no mass, no tenderness and no fullness. Left adnexum displays no mass, no tenderness and no fullness. No erythema around the vagina.  Musculoskeletal:       No active synovitis to any joint.    Lymphadenopathy:       Right cervical: No superficial cervical adenopathy present.      Left cervical: No superficial cervical adenopathy present.       Right axillary: No pectoral and no lateral adenopathy present.       Left axillary: No pectoral and no lateral adenopathy present.      Right: No inguinal adenopathy present.       Left: No inguinal adenopathy present.  Neurological: She is alert and oriented to person, place, and time. She has normal strength and normal reflexes. No cranial nerve deficit or sensory deficit. She displays a negative Romberg sign. Coordination and gait normal.  Skin: Skin is warm and dry. No abrasion, no bruising, no ecchymosis and no rash noted. No cyanosis. Nails show no clubbing.  Psychiatric: She has a normal mood and affect. Her speech is normal and behavior is normal.          Assessment & Plan:  Health maintenance  Pneumovax today  Pap of vaginal cuff today  Hyperlipidemia  Framingham risk  7.8%  DASH diet given she repeatedly declines  RX meds.    She is taking fish oil  Will repeat fasting levels in 6 months  Anxiety/depression  On Celexa  hypothyroidism  Continue  Synthroid  See me as needed      Assessment & Plan:

## 2012-10-10 NOTE — Patient Instructions (Addendum)
See me in 6 months   Come in fasting

## 2012-10-10 NOTE — Telephone Encounter (Signed)
LVM message that wrist x ray was negative

## 2012-10-11 ENCOUNTER — Other Ambulatory Visit (HOSPITAL_COMMUNITY)
Admission: RE | Admit: 2012-10-11 | Discharge: 2012-10-11 | Disposition: A | Discharge status: Home / Self Care | Payer: Medicare Other | Source: Ambulatory Visit | Attending: Internal Medicine | Admitting: Internal Medicine

## 2012-10-11 DIAGNOSIS — Z124 Encounter for screening for malignant neoplasm of cervix: Secondary | ICD-10-CM | POA: Diagnosis not present

## 2012-10-11 DIAGNOSIS — Z1151 Encounter for screening for human papillomavirus (HPV): Secondary | ICD-10-CM | POA: Diagnosis not present

## 2012-10-11 NOTE — Addendum Note (Signed)
Addended by: Mathews Robinsons on: 10/11/2012 08:39 AM   Modules accepted: Orders

## 2012-10-16 ENCOUNTER — Encounter: Payer: Self-pay | Admitting: Internal Medicine

## 2012-10-16 DIAGNOSIS — M8580 Other specified disorders of bone density and structure, unspecified site: Secondary | ICD-10-CM | POA: Insufficient documentation

## 2012-10-17 ENCOUNTER — Encounter: Payer: Self-pay | Admitting: *Deleted

## 2012-11-12 ENCOUNTER — Other Ambulatory Visit: Payer: Self-pay | Admitting: *Deleted

## 2012-11-12 MED ORDER — LEVOTHYROXINE SODIUM 25 MCG PO TABS: 25.0000 ug | ORAL_TABLET | Freq: Every day | ORAL | Status: DC

## 2012-11-12 NOTE — Telephone Encounter (Signed)
Re ordered her synthroid at [facility] notified pt

## 2012-11-14 ENCOUNTER — Telehealth: Payer: Self-pay | Admitting: *Deleted

## 2012-11-14 MED ORDER — LEVOTHYROXINE SODIUM 25 MCG PO TABS: 25.0000 ug | ORAL_TABLET | Freq: Every day | ORAL | Status: DC

## 2012-11-15 ENCOUNTER — Telehealth: Payer: Self-pay | Admitting: *Deleted

## 2012-11-15 NOTE — Telephone Encounter (Signed)
Pt states that she has received her medication

## 2012-11-15 NOTE — Telephone Encounter (Signed)
error 

## 2012-12-12 ENCOUNTER — Other Ambulatory Visit: Payer: Self-pay | Admitting: *Deleted

## 2012-12-12 NOTE — Telephone Encounter (Signed)
Refill request

## 2012-12-13 MED ORDER — CITALOPRAM HYDROBROMIDE 20 MG PO TABS: 20.0000 mg | ORAL_TABLET | Freq: Every day | ORAL | Status: DC

## 2013-04-14 ENCOUNTER — Encounter: Payer: Medicare Other | Admitting: Internal Medicine

## 2013-04-14 DIAGNOSIS — E785 Hyperlipidemia, unspecified: Secondary | ICD-10-CM | POA: Diagnosis not present

## 2013-04-14 LAB — LIPID PANEL
Cholesterol: 191 mg/dL (ref 0–200)
HDL: 50 mg/dL (ref >39)
LDL Cholesterol: 100 mg/dL — ABNORMAL HIGH (ref 0–99)
Total CHOL/HDL Ratio: 3.8 Ratio
Triglycerides: 203 mg/dL — ABNORMAL HIGH (ref <150)
VLDL: 41 mg/dL — ABNORMAL HIGH (ref 0–40)

## 2013-04-15 ENCOUNTER — Telehealth: Payer: Self-pay | Admitting: *Deleted

## 2013-04-15 ENCOUNTER — Encounter: Payer: Self-pay | Admitting: *Deleted

## 2013-04-15 NOTE — Telephone Encounter (Signed)
Message copied by Baldomero Lamy on Tue Apr 15, 2013 10:17 AM ------      Message from: Emi Belfast D      Created: Tue Apr 15, 2013  8:06 AM       [name]  Call pt and let her know that her cholesterol is much better!!  Tell her to keep up the good work.             Just schedule her CPE when it is due   OK to mail labe to her  Continue fish oil , flax seed and low fat diet ------

## 2013-04-15 NOTE — Telephone Encounter (Signed)
Called and left a message with Thayer Headings to call us back.

## 2013-04-15 NOTE — Telephone Encounter (Signed)
Janiced called back.  I let her know her labs are much better. We also scheduled a Cpe for August

## 2013-04-15 NOTE — Progress Notes (Signed)
   Subjective:    Patient ID: [name], female    DOB: 1946-01-31, 68 y.o.   MRN: [id number]  HPI  Labs only  Review of Systems     Objective:   Physical Exam        Assessment & Dean:

## 2013-04-15 NOTE — Telephone Encounter (Signed)
Called Thayer Headings and left a message for her to return our phone call.

## 2013-05-12 ENCOUNTER — Other Ambulatory Visit: Payer: Self-pay | Admitting: *Deleted

## 2013-05-12 MED ORDER — LEVOTHYROXINE SODIUM 25 MCG PO TABS: 25.0000 ug | ORAL_TABLET | Freq: Every day | ORAL | Status: DC

## 2013-05-12 NOTE — Telephone Encounter (Signed)
Refill request

## 2013-06-03 DIAGNOSIS — M779 Enthesopathy, unspecified: Secondary | ICD-10-CM | POA: Diagnosis not present

## 2013-06-03 DIAGNOSIS — M25473 Effusion, unspecified ankle: Secondary | ICD-10-CM | POA: Diagnosis not present

## 2013-06-03 DIAGNOSIS — M25476 Effusion, unspecified foot: Secondary | ICD-10-CM | POA: Diagnosis not present

## 2013-06-03 DIAGNOSIS — S93609A Unspecified sprain of unspecified foot, initial encounter: Secondary | ICD-10-CM | POA: Diagnosis not present

## 2013-06-20 ENCOUNTER — Other Ambulatory Visit: Payer: Self-pay | Admitting: *Deleted

## 2013-06-20 MED ORDER — CITALOPRAM HYDROBROMIDE 20 MG PO TABS: 20.0000 mg | ORAL_TABLET | Freq: Every day | ORAL | Status: DC

## 2013-06-20 NOTE — Telephone Encounter (Signed)
Refill request

## 2013-08-06 ENCOUNTER — Other Ambulatory Visit: Payer: Self-pay | Admitting: Internal Medicine

## 2013-08-07 NOTE — Telephone Encounter (Signed)
Refill request

## 2013-10-10 ENCOUNTER — Other Ambulatory Visit: Payer: Self-pay | Admitting: *Deleted

## 2013-10-10 NOTE — Telephone Encounter (Signed)
Received fax from [facility] for refills,looks like pt has appt on 8/6.Would you like to wait & refill in office ?

## 2013-10-11 MED ORDER — LEVOTHYROXINE SODIUM 25 MCG PO TABS: 25.0000 ug | ORAL_TABLET | Freq: Every day | ORAL | Status: DC

## 2013-10-11 MED ORDER — CITALOPRAM HYDROBROMIDE 20 MG PO TABS: 20.0000 mg | ORAL_TABLET | Freq: Every day | ORAL | Status: DC

## 2013-10-15 ENCOUNTER — Other Ambulatory Visit: Payer: Self-pay | Admitting: *Deleted

## 2013-10-15 ENCOUNTER — Encounter: Payer: Self-pay | Admitting: Internal Medicine

## 2013-10-15 ENCOUNTER — Ambulatory Visit (INDEPENDENT_AMBULATORY_CARE_PROVIDER_SITE_OTHER): Payer: Medicare Other | Admitting: Internal Medicine

## 2013-10-15 VITALS — BP 115/71 | HR 79 | Temp 97.8°F | Resp 17 | Ht 68.0 in | Wt 181.0 lb

## 2013-10-15 DIAGNOSIS — Z8051 Family history of malignant neoplasm of kidney: Secondary | ICD-10-CM | POA: Insufficient documentation

## 2013-10-15 DIAGNOSIS — R319 Hematuria, unspecified: Secondary | ICD-10-CM | POA: Insufficient documentation

## 2013-10-15 DIAGNOSIS — Z0000 Encounter for general adult medical examination without abnormal findings: Secondary | ICD-10-CM | POA: Diagnosis not present

## 2013-10-15 DIAGNOSIS — E559 Vitamin D deficiency, unspecified: Secondary | ICD-10-CM | POA: Diagnosis not present

## 2013-10-15 DIAGNOSIS — M949 Disorder of cartilage, unspecified: Secondary | ICD-10-CM

## 2013-10-15 DIAGNOSIS — M8580 Other specified disorders of bone density and structure, unspecified site: Secondary | ICD-10-CM

## 2013-10-15 DIAGNOSIS — M899 Disorder of bone, unspecified: Secondary | ICD-10-CM

## 2013-10-15 DIAGNOSIS — E785 Hyperlipidemia, unspecified: Secondary | ICD-10-CM

## 2013-10-15 DIAGNOSIS — Z1231 Encounter for screening mammogram for malignant neoplasm of breast: Secondary | ICD-10-CM

## 2013-10-15 DIAGNOSIS — Z139 Encounter for screening, unspecified: Secondary | ICD-10-CM

## 2013-10-15 HISTORY — DX: Family history of malignant neoplasm of kidney: Z80.51

## 2013-10-15 LAB — COMPLETE METABOLIC PANEL WITH GFR
ALK PHOS: 111 U/L (ref 39–117)
ALT: 33 U/L (ref 0–35)
AST: 26 U/L (ref 0–37)
Albumin: 4.5 g/dL (ref 3.5–5.2)
BUN: 17 mg/dL (ref 6–23)
CO2: 28 mEq/L (ref 19–32)
Calcium: 9.9 mg/dL (ref 8.4–10.5)
Chloride: 103 mEq/L (ref 96–112)
Creat: 0.68 mg/dL (ref 0.50–1.10)
GFR, Est African American: >89 mL/min
GLUCOSE: 88 mg/dL (ref 70–99)
Potassium: 4.4 mEq/L (ref 3.5–5.3)
SODIUM: 139 meq/L (ref 135–145)
Total Bilirubin: 1 mg/dL (ref 0.2–1.2)
Total Protein: 7 g/dL (ref 6.0–8.3)

## 2013-10-15 LAB — CBC WITH DIFFERENTIAL/PLATELET
BASOS ABS: 0.1 10*3/uL (ref 0.0–0.1)
Basophils Relative: 1 % (ref 0–1)
Eosinophils Absolute: 0.2 10*3/uL (ref 0.0–0.7)
Eosinophils Relative: 3 % (ref 0–5)
HEMATOCRIT: 38.9 % (ref 36.0–46.0)
HEMOGLOBIN: 13.3 g/dL (ref 12.0–15.0)
Lymphocytes Relative: 37 % (ref 12–46)
Lymphs Abs: 2.4 10*3/uL (ref 0.7–4.0)
MCH: 30.4 pg (ref 26.0–34.0)
MCHC: 34.2 g/dL (ref 30.0–36.0)
MCV: 89 fL (ref 78.0–100.0)
MONO ABS: 0.4 10*3/uL (ref 0.1–1.0)
Monocytes Relative: 6 % (ref 3–12)
NEUTROS ABS: 3.4 10*3/uL (ref 1.7–7.7)
Neutrophils Relative %: 53 % (ref 43–77)
Platelets: 258 10*3/uL (ref 150–400)
RBC: 4.37 MIL/uL (ref 3.87–5.11)
RDW: 13.4 % (ref 11.5–15.5)
WBC: 6.4 10*3/uL (ref 4.0–10.5)

## 2013-10-15 LAB — LIPID PANEL
CHOLESTEROL: 227 mg/dL — AB (ref 0–200)
HDL: 58 mg/dL (ref >39)
LDL Cholesterol: 146 mg/dL — ABNORMAL HIGH (ref 0–99)
TRIGLYCERIDES: 113 mg/dL (ref <150)
Total CHOL/HDL Ratio: 3.9 Ratio
VLDL: 23 mg/dL (ref 0–40)

## 2013-10-15 LAB — POCT URINALYSIS DIPSTICK
Bilirubin, UA: NEGATIVE
GLUCOSE UA: NEGATIVE
Ketones, UA: NEGATIVE
NITRITE UA: NEGATIVE
Protein, UA: NEGATIVE
UROBILINOGEN UA: 2
pH, UA: 5

## 2013-10-15 LAB — HEMOCCULT GUIAC POC 1CARD (OFFICE): FECAL OCCULT BLD: NEGATIVE

## 2013-10-15 LAB — TSH: TSH: 2.606 u[IU]/mL (ref 0.350–4.500)

## 2013-10-15 NOTE — Progress Notes (Signed)
   Subjective:    Patient ID: [name], female    DOB: 15-May-1945, 67 y.o.   MRN: [id number]  HPI  [name] is here for   CPE  HM:   She is due for mm and DEXA   She is S/P hysterectomy  Had one pos vaginal HPV in 2013 but repeat vaginal HPV neg in 2014   Hyperlipidemia:  She is using fish oil and flax seed.  She is fasting today   Review of Systems  Respiratory: Negative for cough, chest tightness, shortness of breath, wheezing and stridor.   Cardiovascular: Negative for chest pain, palpitations and leg swelling.  Gastrointestinal: Negative for abdominal pain.       Objective:   Physical Exam  Physical Exam  Nursing note and vitals reviewed.  Constitutional: She is oriented to person, place, and time. She appears well-developed and well-nourished.  HENT:  Head: Normocephalic and atraumatic.  Right Ear: Tympanic membrane and ear canal normal. No drainage. Tympanic membrane is not injected and not erythematous.  Left Ear: Tympanic membrane and ear canal normal. No drainage. Tympanic membrane is not injected and not erythematous.  Nose: Nose normal. Right sinus exhibits no maxillary sinus tenderness and no frontal sinus tenderness. Left sinus exhibits no maxillary sinus tenderness and no frontal sinus tenderness.  Mouth/Throat: Oropharynx is clear and moist. No oral lesions. No oropharyngeal exudate.  Eyes: Conjunctivae and EOM are normal. Pupils are equal, round, and reactive to light.  Neck: Normal range of motion. Neck supple. No JVD present. Carotid bruit is not present. No mass and no thyromegaly present.  Cardiovascular: Normal rate, regular rhythm, S1 normal, S2 normal and intact distal pulses. Exam reveals no gallop and no friction rub.  No murmur heard.  Pulses:  Carotid pulses are 2+ on the right side, and 2+ on the left side.  Dorsalis pedis pulses are 2+ on the right side, and 2+ on the left side.  No carotid bruit. No LE edema  Pulmonary/Chest: Breath sounds  normal. She has no wheezes. She has no rales. She exhibits no tenderness.  Abdominal: Soft. Bowel sounds are normal. She exhibits no distension and no mass. There is no hepatosplenomegaly. There is no tenderness. There is no CVA tenderness.  Musculoskeletal: Normal range of motion.  No active synovitis to joints.  Lymphadenopathy:  She has no cervical adenopathy.  She has no axillary adenopathy.  Right: No inguinal and no supraclavicular adenopathy present.  Left: No inguinal and no supraclavicular adenopathy present.  Neurological: She is alert and oriented to person, place, and time. She has normal strength and normal reflexes. She displays no tremor. No cranial nerve deficit or sensory deficit. Coordination and gait normal.  Skin: Skin is warm and dry. No rash noted. No cyanosis. Nails show no clubbing.  Psychiatric: She has a normal mood and affect. Her speech is normal and behavior is normal. Cognition and memory are normal.           Assessment & Dean:  HM:  Will schedule 3D mm and dexa.    Vaginal pap next year.   Colon[place] 2009  [place]  Hyerlipidemia  Will check today  Hematuria   Pt asymptomatic  Will send for culture but with [facility] renal CA  In mother will schedule    CT abd/pelvis  W/w/o  Hypothyroidism:  congtinue levothyrroxine recheck today

## 2013-10-15 NOTE — Patient Instructions (Signed)
To xray to schedule Ct of abd/pelvis  Hematuria protocol  Schedule mammogram and bone density at [facility]  See me in 7-10 days

## 2013-10-16 ENCOUNTER — Telehealth: Payer: Self-pay | Admitting: *Deleted

## 2013-10-16 NOTE — Telephone Encounter (Signed)
Pt called & given her results and labs mailed

## 2013-10-17 ENCOUNTER — Ambulatory Visit (HOSPITAL_BASED_OUTPATIENT_CLINIC_OR_DEPARTMENT_OTHER)
Admission: RE | Admit: 2013-10-17 | Discharge: 2013-10-17 | Disposition: A | Discharge status: Home / Self Care | Payer: Medicare Other | Source: Ambulatory Visit | Attending: Internal Medicine | Admitting: Internal Medicine

## 2013-10-17 ENCOUNTER — Encounter (HOSPITAL_BASED_OUTPATIENT_CLINIC_OR_DEPARTMENT_OTHER): Payer: Self-pay

## 2013-10-17 DIAGNOSIS — R319 Hematuria, unspecified: Secondary | ICD-10-CM | POA: Insufficient documentation

## 2013-10-17 DIAGNOSIS — Z8051 Family history of malignant neoplasm of kidney: Secondary | ICD-10-CM | POA: Diagnosis not present

## 2013-10-17 LAB — URINE CULTURE
Colony Count: NO GROWTH
Organism ID, Bacteria: NO GROWTH

## 2013-10-17 MED ORDER — IOHEXOL 300 MG/ML  SOLN
100.0000 mL | Freq: Once | INTRAMUSCULAR | Status: AC | PRN
Start: 2013-10-17 — End: 2013-10-17
  Administered 2013-10-17: 100 mL via INTRAVENOUS

## 2013-10-21 ENCOUNTER — Encounter: Payer: Self-pay | Admitting: *Deleted

## 2013-10-21 ENCOUNTER — Ambulatory Visit (INDEPENDENT_AMBULATORY_CARE_PROVIDER_SITE_OTHER): Payer: Medicare Other

## 2013-10-21 DIAGNOSIS — M949 Disorder of cartilage, unspecified: Secondary | ICD-10-CM

## 2013-10-21 DIAGNOSIS — M899 Disorder of bone, unspecified: Secondary | ICD-10-CM | POA: Diagnosis not present

## 2013-10-21 DIAGNOSIS — M8580 Other specified disorders of bone density and structure, unspecified site: Secondary | ICD-10-CM

## 2013-10-21 DIAGNOSIS — Z780 Asymptomatic menopausal state: Secondary | ICD-10-CM | POA: Diagnosis not present

## 2013-10-23 ENCOUNTER — Ambulatory Visit (INDEPENDENT_AMBULATORY_CARE_PROVIDER_SITE_OTHER): Payer: Medicare Other | Admitting: Internal Medicine

## 2013-10-23 ENCOUNTER — Telehealth: Payer: Self-pay | Admitting: *Deleted

## 2013-10-23 ENCOUNTER — Encounter: Payer: Self-pay | Admitting: Internal Medicine

## 2013-10-23 ENCOUNTER — Encounter: Payer: Self-pay | Admitting: *Deleted

## 2013-10-23 VITALS — BP 112/70 | HR 77 | Temp 98.1°F | Resp 16 | Ht 67.0 in | Wt 180.0 lb

## 2013-10-23 DIAGNOSIS — Z8051 Family history of malignant neoplasm of kidney: Secondary | ICD-10-CM | POA: Diagnosis not present

## 2013-10-23 DIAGNOSIS — R319 Hematuria, unspecified: Secondary | ICD-10-CM

## 2013-10-23 NOTE — Addendum Note (Signed)
Addended by: Sherrye Payor on: 10/23/2013 03:00 PM   Modules accepted: Orders

## 2013-10-23 NOTE — Patient Instructions (Signed)
Will set up referral to urology

## 2013-10-23 NOTE — Progress Notes (Signed)
Subjective:    Patient ID: [name], female    DOB: [birth year], 68 y.o.   MRN: [id number]  HPI Thayer Headings is here for follow up of hematuria    Urine cultrure neg    FH mother had renal cell CA  See CT w/wo  Small subcentimeter hypodense renal lesion .          Allergies  Allergen Reactions  . Penicillins    Past Medical History  Diagnosis Date  . Thyroid disease     hypothyroid  . Depression   . Hyperlipidemia   . Menopause    Past Surgical History  Procedure Laterality Date  . Abdominal hysterectomy     History   Social History  . Marital Status: Divorced    Spouse Name: N/A    Number of Children: N/A  . Years of Education: N/A   Occupational History  . Not on file.   Social History Main Topics  . Smoking status: Never Smoker   . Smokeless tobacco: Never Used  . Alcohol Use: 0.5 oz/week    1 drink(s) per week  . Drug Use: No  . Sexual Activity: Not Currently   Other Topics Concern  . Not on file   Social History Narrative  . No narrative on file   Family History  Problem Relation Age of Onset  . Heart disease Mother   . Diabetes Mother   . Heart disease Father    Patient Active Problem List   Diagnosis Date Noted  . Hematuria 10/15/2013  . FH: kidney cancer  mother 10/15/2013  . Osteopenia 10/16/2012  . Vaginal high risk HPV DNA test positive  repeat 2014  neg  09/13/2011  . History of depression 07/31/2011  . H/O: hysterectomy 07/31/2011  . Hypothyroidism 06/20/2011  . Hyperlipidemia 06/20/2011  . Anxiety 06/20/2011  . VERTIGO 03/16/2009   Current Outpatient Prescriptions on File Prior to Visit  Medication Sig Dispense Refill  . aspirin 81 MG EC tablet Take 81 mg by mouth daily.        . Calcium Carbonate-Vit D-Min (CALCIUM 1200 PO) Take 1 tablet by mouth daily.        . cholecalciferol (VITAMIN D) 1000 UNITS tablet Take 1,000 Units by mouth daily.        . citalopram (CELEXA) 20 MG tablet Take 1 tablet (20 mg total) by mouth daily.   90 tablet  0  . fish oil-omega-3 fatty acids 1000 MG capsule Take 1 capsule by mouth daily.      . Flaxseed, Linseed, (FLAX SEED OIL) 1000 MG CAPS Take 1 capsule by mouth daily.      Marland Kitchen GARLIC PO Take 1 capsule by mouth.      . levothyroxine (SYNTHROID, LEVOTHROID) 25 MCG tablet Take 1 tablet (25 mcg total) by mouth daily.  90 tablet  0  . Multiple Vitamin (MULTIVITAMIN) tablet Take 1 tablet by mouth daily.      Marland Kitchen OVER THE COUNTER MEDICATION 3 capsules daily with supper.       No current facility-administered medications on file prior to visit.       Review of Systems See HPI    Objective:   Physical Exam Physical Exam  Nursing note and vitals reviewed.  Constitutional: She is oriented to person, place, and time. She appears well-developed and well-nourished.  HENT:  Head: Normocephalic and atraumatic.  Cardiovascular: Normal rate and regular rhythm. Exam reveals no gallop and no friction rub.  No murmur heard.  Pulmonary/Chest: Breath sounds normal. She has no wheezes. She has no rales.  Neurological: She is alert and oriented to person, place, and time.  Skin: Skin is warm and dry.  Psychiatric: She has a normal mood and affect. Her behavior is normal.          Assessment & Dean:  Microscopic hematuria   With Renal CA in [place] :  Repeat chemstrip still with large blood.     Will need cystoscopy  Will refer to urology  Pt voices understanding

## 2013-10-24 ENCOUNTER — Other Ambulatory Visit: Payer: Self-pay | Admitting: Internal Medicine

## 2013-10-24 ENCOUNTER — Ambulatory Visit: Payer: PRIVATE HEALTH INSURANCE

## 2013-10-24 ENCOUNTER — Ambulatory Visit: Admission: RE | Admit: 2013-10-24 | Payer: Medicare Other | Source: Ambulatory Visit

## 2013-10-24 ENCOUNTER — Ambulatory Visit
Admission: RE | Admit: 2013-10-24 | Discharge: 2013-10-24 | Disposition: A | Discharge status: Home / Self Care | Payer: Medicare Other | Source: Ambulatory Visit | Attending: Internal Medicine | Admitting: Internal Medicine

## 2013-10-24 DIAGNOSIS — Z0000 Encounter for general adult medical examination without abnormal findings: Secondary | ICD-10-CM

## 2013-10-24 DIAGNOSIS — Z1231 Encounter for screening mammogram for malignant neoplasm of breast: Secondary | ICD-10-CM

## 2013-10-24 NOTE — Telephone Encounter (Signed)
Called pt & left voice mail message w/ results

## 2013-10-30 ENCOUNTER — Telehealth: Payer: Self-pay | Admitting: *Deleted

## 2013-10-30 MED ORDER — LEVOTHYROXINE SODIUM 25 MCG PO TABS: 25.0000 ug | ORAL_TABLET | Freq: Every day | ORAL | Status: DC

## 2013-10-30 MED ORDER — CITALOPRAM HYDROBROMIDE 20 MG PO TABS: 20.0000 mg | ORAL_TABLET | Freq: Every day | ORAL | Status: DC

## 2013-10-30 NOTE — Telephone Encounter (Signed)
Fuller Plan called she needs her   levothyroxine (SYNTHROID, LEVOTHROID) 25 MCG tablet citalopram (CELEXA) 20 MG tablet   Refilled and sent to [facility]

## 2013-12-12 DIAGNOSIS — R312 Other microscopic hematuria: Secondary | ICD-10-CM | POA: Diagnosis not present

## 2013-12-12 DIAGNOSIS — N281 Cyst of kidney, acquired: Secondary | ICD-10-CM | POA: Diagnosis not present

## 2014-03-31 ENCOUNTER — Other Ambulatory Visit: Payer: Self-pay | Admitting: Internal Medicine

## 2014-04-01 NOTE — Telephone Encounter (Signed)
Refill request

## 2014-05-19 DIAGNOSIS — D3132 Benign neoplasm of left choroid: Secondary | ICD-10-CM | POA: Diagnosis not present

## 2014-06-22 ENCOUNTER — Other Ambulatory Visit: Payer: Self-pay | Admitting: *Deleted

## 2014-06-29 ENCOUNTER — Other Ambulatory Visit: Payer: Self-pay | Admitting: *Deleted

## 2014-06-29 NOTE — Telephone Encounter (Signed)
Refill request

## 2014-06-30 MED ORDER — LEVOTHYROXINE SODIUM 25 MCG PO TABS: 25.0000 ug | ORAL_TABLET | Freq: Every day | ORAL | Status: DC

## 2014-06-30 MED ORDER — CITALOPRAM HYDROBROMIDE 20 MG PO TABS: 20.0000 mg | ORAL_TABLET | Freq: Every day | ORAL | Status: DC

## 2014-09-22 ENCOUNTER — Other Ambulatory Visit: Payer: Self-pay

## 2014-09-22 DIAGNOSIS — Z1231 Encounter for screening mammogram for malignant neoplasm of breast: Secondary | ICD-10-CM

## 2014-10-27 ENCOUNTER — Ambulatory Visit
Admission: RE | Admit: 2014-10-27 | Discharge: 2014-10-27 | Disposition: A | Discharge status: Home / Self Care | Payer: Medicare Other | Source: Ambulatory Visit

## 2014-10-27 DIAGNOSIS — Z1231 Encounter for screening mammogram for malignant neoplasm of breast: Secondary | ICD-10-CM

## 2014-11-02 ENCOUNTER — Ambulatory Visit (INDEPENDENT_AMBULATORY_CARE_PROVIDER_SITE_OTHER): Payer: Medicare Other | Admitting: Physician Assistant

## 2014-11-02 ENCOUNTER — Encounter: Payer: Self-pay | Admitting: Physician Assistant

## 2014-11-02 VITALS — BP 116/79 | HR 77 | Ht 68.0 in | Wt 187.0 lb

## 2014-11-02 DIAGNOSIS — Z13220 Encounter for screening for lipoid disorders: Secondary | ICD-10-CM

## 2014-11-02 DIAGNOSIS — Z131 Encounter for screening for diabetes mellitus: Secondary | ICD-10-CM

## 2014-11-02 DIAGNOSIS — Z8659 Personal history of other mental and behavioral disorders: Secondary | ICD-10-CM | POA: Diagnosis not present

## 2014-11-02 DIAGNOSIS — Z0000 Encounter for general adult medical examination without abnormal findings: Secondary | ICD-10-CM

## 2014-11-02 DIAGNOSIS — F419 Anxiety disorder, unspecified: Secondary | ICD-10-CM

## 2014-11-02 DIAGNOSIS — E039 Hypothyroidism, unspecified: Secondary | ICD-10-CM | POA: Diagnosis not present

## 2014-11-02 DIAGNOSIS — Z23 Encounter for immunization: Secondary | ICD-10-CM | POA: Diagnosis not present

## 2014-11-02 DIAGNOSIS — Z1159 Encounter for screening for other viral diseases: Secondary | ICD-10-CM

## 2014-11-02 DIAGNOSIS — K219 Gastro-esophageal reflux disease without esophagitis: Secondary | ICD-10-CM | POA: Diagnosis not present

## 2014-11-02 MED ORDER — LEVOTHYROXINE SODIUM 25 MCG PO TABS: 25.0000 ug | ORAL_TABLET | Freq: Every day | ORAL | Status: DC

## 2014-11-02 MED ORDER — RANITIDINE HCL 150 MG PO TABS: 150.0000 mg | ORAL_TABLET | Freq: Two times a day (BID) | ORAL | Status: DC

## 2014-11-02 MED ORDER — CITALOPRAM HYDROBROMIDE 20 MG PO TABS: 20.0000 mg | ORAL_TABLET | Freq: Every day | ORAL | Status: DC

## 2014-11-03 NOTE — Progress Notes (Signed)
   Subjective:    Patient ID: [name], female    DOB: 1945-11-23, 69 y.o.   MRN: [id number]  HPI Pt is a 69 yo female who presents to the clinic to establish care.   .. Active Ambulatory Problems    Diagnosis Date Noted  . VERTIGO 03/16/2009  . Hypothyroidism 06/20/2011  . Hyperlipidemia 06/20/2011  . Anxiety 06/20/2011  . History of depression 07/31/2011  . H/O: hysterectomy 07/31/2011  . Vaginal high risk HPV DNA test positive  repeat 2014  neg  09/13/2011  . Osteopenia 10/16/2012  . Hematuria 10/15/2013  . FH: kidney cancer  mother 10/15/2013  . Esophageal reflux 11/02/2014   Resolved Ambulatory Problems    Diagnosis Date Noted  . No Resolved Ambulatory Problems   Past Medical History  Diagnosis Date  . Thyroid disease   . Depression   . Menopause    .Marland Kitchen Family History  Problem Relation Age of Onset  . Heart disease Mother   . Diabetes Mother   . Heart disease Father    . Social History   Social History  . Marital Status: Divorced    Spouse Name: N/A  . Number of Children: N/A  . Years of Education: N/A   Occupational History  . Not on file.   Social History Main Topics  . Smoking status: Never Smoker   . Smokeless tobacco: Never Used  . Alcohol Use: 0.5 oz/week    1 drink(s) per week  . Drug Use: No  . Sexual Activity: Not Currently   Other Topics Concern  . Not on file   Social History Narrative   Pt needs refill on medication.   Hx of anxiety/depression. Done well on celexa for many years and ready to get off medication.   Hypothyroidism- needs refills. No problems or concerns.   Pt is having some indigestion after meals. Taking zantac OTC and works. Would like rx. No nausea or vomiting. No abdominal pain.    Review of Systems  All other systems reviewed and are negative.      Objective:   Physical Exam  Constitutional: She is oriented to person, place, and time. She appears well-developed and well-nourished.   Cardiovascular: Normal rate, regular rhythm and normal heart sounds.   Pulmonary/Chest: Effort normal and breath sounds normal.  Neurological: She is alert and oriented to person, place, and time.  Psychiatric: She has a normal mood and affect. Her behavior is normal.          Assessment & Dean:  Prevnar and Tdap given today.   Up to date on colonoscopy. Stool cards given for yearly check.   Hypothyroidism- TSH ordered for recheck. Pt needed rx refilled today. Sent to pharmacy.   Anxiety/history of depression- discussed cutting celexa in half for 4-6 weeks and see if notice any difference in mood. If doing well can take every other day for 2 weeks and stop.   GERD- rx given for zantac bid as needed for indigestion. Suggested diet evaluation and weight loss to also control symptoms.   Discussed need for yearly medicare wellness and bimanuel exam.

## 2014-11-06 DIAGNOSIS — Z13220 Encounter for screening for lipoid disorders: Secondary | ICD-10-CM | POA: Diagnosis not present

## 2014-11-06 DIAGNOSIS — Z0000 Encounter for general adult medical examination without abnormal findings: Secondary | ICD-10-CM | POA: Diagnosis not present

## 2014-11-06 DIAGNOSIS — Z1159 Encounter for screening for other viral diseases: Secondary | ICD-10-CM | POA: Diagnosis not present

## 2014-11-06 DIAGNOSIS — E039 Hypothyroidism, unspecified: Secondary | ICD-10-CM | POA: Diagnosis not present

## 2014-11-06 DIAGNOSIS — R7301 Impaired fasting glucose: Secondary | ICD-10-CM | POA: Diagnosis not present

## 2014-11-06 DIAGNOSIS — Z131 Encounter for screening for diabetes mellitus: Secondary | ICD-10-CM | POA: Diagnosis not present

## 2014-11-07 LAB — COMPLETE METABOLIC PANEL WITH GFR
ALBUMIN: 4.3 g/dL (ref 3.6–5.1)
ALK PHOS: 87 U/L (ref 33–130)
ALT: 25 U/L (ref 6–29)
AST: 21 U/L (ref 10–35)
BUN: 13 mg/dL (ref 7–25)
CALCIUM: 9.4 mg/dL (ref 8.6–10.4)
CO2: 27 mmol/L (ref 20–31)
Chloride: 103 mmol/L (ref 98–110)
Creat: 0.64 mg/dL (ref 0.50–0.99)
Glucose, Bld: 90 mg/dL (ref 65–99)
POTASSIUM: 4.8 mmol/L (ref 3.5–5.3)
Sodium: 141 mmol/L (ref 135–146)
Total Bilirubin: 0.6 mg/dL (ref 0.2–1.2)
Total Protein: 6.7 g/dL (ref 6.1–8.1)

## 2014-11-07 LAB — CBC WITH DIFFERENTIAL/PLATELET
Basophils Absolute: 0 10*3/uL (ref 0.0–0.1)
Basophils Relative: 0 % (ref 0–1)
Eosinophils Absolute: 0.2 10*3/uL (ref 0.0–0.7)
Eosinophils Relative: 3 % (ref 0–5)
HCT: 39.1 % (ref 36.0–46.0)
Hemoglobin: 13 g/dL (ref 12.0–15.0)
Lymphocytes Relative: 38 % (ref 12–46)
Lymphs Abs: 2.2 10*3/uL (ref 0.7–4.0)
MCH: 31.2 pg (ref 26.0–34.0)
MCHC: 33.2 g/dL (ref 30.0–36.0)
MCV: 93.8 fL (ref 78.0–100.0)
MONO ABS: 0.3 10*3/uL (ref 0.1–1.0)
MPV: 10.8 fL (ref 8.6–12.4)
Monocytes Relative: 5 % (ref 3–12)
NEUTROS ABS: 3.2 10*3/uL (ref 1.7–7.7)
Neutrophils Relative %: 54 % (ref 43–77)
Platelets: 278 10*3/uL (ref 150–400)
RBC: 4.17 MIL/uL (ref 3.87–5.11)
RDW: 13.4 % (ref 11.5–15.5)
WBC: 5.9 10*3/uL (ref 4.0–10.5)

## 2014-11-07 LAB — LIPID PANEL
Cholesterol: 223 mg/dL — ABNORMAL HIGH (ref 125–200)
HDL: 50 mg/dL (ref >=46)
LDL CALC: 143 mg/dL — AB (ref <130)
Total CHOL/HDL Ratio: 4.5 Ratio (ref <=5.0)
Triglycerides: 152 mg/dL — ABNORMAL HIGH (ref <150)
VLDL: 30 mg/dL (ref <30)

## 2014-11-07 LAB — TSH: TSH: 3.575 u[IU]/mL (ref 0.350–4.500)

## 2014-11-07 LAB — HEMOGLOBIN A1C
Hgb A1c MFr Bld: 6.2 % — ABNORMAL HIGH (ref <5.7)
Mean Plasma Glucose: 131 mg/dL — ABNORMAL HIGH (ref <117)

## 2014-11-07 LAB — HEPATITIS C ANTIBODY: HCV Ab: NEGATIVE

## 2014-11-18 LAB — POC HEMOCCULT BLD/STL (HOME/3-CARD/SCREEN)
Card #2 Fecal Occult Blod, POC: NEGATIVE
FECAL OCCULT BLD: NEGATIVE
Fecal Occult Blood, POC: NEGATIVE

## 2014-11-18 NOTE — Addendum Note (Signed)
Addended by: Terance Hart on: 11/18/2014 04:47 PM   Modules accepted: Orders

## 2014-12-29 ENCOUNTER — Other Ambulatory Visit (HOSPITAL_COMMUNITY)
Admission: RE | Admit: 2014-12-29 | Discharge: 2014-12-29 | Disposition: A | Discharge status: Home / Self Care | Payer: Medicare Other | Source: Ambulatory Visit | Attending: Family Medicine | Admitting: Family Medicine

## 2014-12-29 ENCOUNTER — Encounter: Payer: Self-pay | Admitting: Physician Assistant

## 2014-12-29 ENCOUNTER — Ambulatory Visit (INDEPENDENT_AMBULATORY_CARE_PROVIDER_SITE_OTHER): Payer: Medicare Other | Admitting: Physician Assistant

## 2014-12-29 VITALS — BP 123/80 | HR 74 | Wt 179.0 lb

## 2014-12-29 DIAGNOSIS — Z01411 Encounter for gynecological examination (general) (routine) with abnormal findings: Secondary | ICD-10-CM | POA: Diagnosis not present

## 2014-12-29 DIAGNOSIS — Z01419 Encounter for gynecological examination (general) (routine) without abnormal findings: Secondary | ICD-10-CM

## 2014-12-29 DIAGNOSIS — Z0000 Encounter for general adult medical examination without abnormal findings: Secondary | ICD-10-CM | POA: Diagnosis not present

## 2014-12-29 DIAGNOSIS — Z23 Encounter for immunization: Secondary | ICD-10-CM | POA: Diagnosis not present

## 2014-12-29 NOTE — Progress Notes (Signed)
Subjective:    [name] is a 69 y.o. female who presents for Medicare Annual/Subsequent preventive examination.  Preventive Screening-Counseling & Management  Tobacco History  Smoking status  . Never Smoker   Smokeless tobacco  . Never Used     Problems Prior to Visit Had a CPE but needed medicare annual visit.   Current Problems (verified) Patient Active Problem List   Diagnosis Date Noted  . Esophageal reflux 11/02/2014  . Hematuria 10/15/2013  . FH: kidney cancer  mother 10/15/2013  . Osteopenia 10/16/2012  . Vaginal high risk HPV DNA test positive  repeat 2014  neg  09/13/2011  . History of depression 07/31/2011  . H/O: hysterectomy 07/31/2011  . Hypothyroidism 06/20/2011  . Hyperlipidemia 06/20/2011  . Anxiety 06/20/2011  . VERTIGO 03/16/2009    Medications Prior to Visit Current Outpatient Prescriptions on File Prior to Visit  Medication Sig Dispense Refill  . aspirin 81 MG EC tablet Take 81 mg by mouth daily.      . Black Cohosh 40 MG CAPS Take by mouth daily.    . Calcium Carbonate-Vit D-Min (CALCIUM 1200 PO) Take 2 tablets by mouth daily.     . cholecalciferol (VITAMIN D) 1000 UNITS tablet Take 1,000 Units by mouth daily.      . citalopram (CELEXA) 20 MG tablet Take 1 tablet (20 mg total) by mouth daily. 90 tablet 1  . fish oil-omega-3 fatty acids 1000 MG capsule Take 1 capsule by mouth daily.    . Flaxseed, Linseed, (FLAX SEED OIL) 1000 MG CAPS Take 1 capsule by mouth daily.    Marland Kitchen GARLIC PO Take 1 capsule by mouth.    . levothyroxine (SYNTHROID, LEVOTHROID) 25 MCG tablet Take 1 tablet (25 mcg total) by mouth daily. 90 tablet 1  . Multiple Vitamin (MULTIVITAMIN) tablet Take 1 tablet by mouth daily.    . Multiple Vitamins-Minerals (HAIR/SKIN/NAILS PO) Take by mouth daily.    . ranitidine (ZANTAC) 150 MG tablet Take 1 tablet (150 mg total) by mouth 2 (two) times daily. 180 tablet 1   No current facility-administered medications on file prior to visit.     Current Medications (verified) Current Outpatient Prescriptions  Medication Sig Dispense Refill  . aspirin 81 MG EC tablet Take 81 mg by mouth daily.      . Black Cohosh 40 MG CAPS Take by mouth daily.    . Calcium Carbonate-Vit D-Min (CALCIUM 1200 PO) Take 2 tablets by mouth daily.     . cholecalciferol (VITAMIN D) 1000 UNITS tablet Take 1,000 Units by mouth daily.      . citalopram (CELEXA) 20 MG tablet Take 1 tablet (20 mg total) by mouth daily. 90 tablet 1  . fish oil-omega-3 fatty acids 1000 MG capsule Take 1 capsule by mouth daily.    . Flaxseed, Linseed, (FLAX SEED OIL) 1000 MG CAPS Take 1 capsule by mouth daily.    Marland Kitchen GARLIC PO Take 1 capsule by mouth.    . levothyroxine (SYNTHROID, LEVOTHROID) 25 MCG tablet Take 1 tablet (25 mcg total) by mouth daily. 90 tablet 1  . Multiple Vitamin (MULTIVITAMIN) tablet Take 1 tablet by mouth daily.    . Multiple Vitamins-Minerals (HAIR/SKIN/NAILS PO) Take by mouth daily.    . ranitidine (ZANTAC) 150 MG tablet Take 1 tablet (150 mg total) by mouth 2 (two) times daily. 180 tablet 1   No current facility-administered medications for this visit.     Allergies (verified) Penicillins   PAST HISTORY  Family  History Family History  Problem Relation Age of Onset  . Heart disease Mother   . Diabetes Mother   . Heart disease Father     Social History Social History  Substance Use Topics  . Smoking status: Never Smoker   . Smokeless tobacco: Never Used  . Alcohol Use: 0.5 oz/week    1 drink(s) per week     Are there smokers in your home (other than you)? No  Risk Factors Current exercise habits: Gym/ health club routine includes light weights and walking on track .  Dietary issues discussed: None   Cardiac risk factors: advanced age (older than 7 for men, 54 for women).  Depression Screen (Note: if answer to either of the following is "Yes", a more complete depression screening is indicated)   Over the past two weeks, have you  felt down, depressed or hopeless? No  Over the past two weeks, have you felt little interest or pleasure in doing things? No  Have you lost interest or pleasure in daily life? No  Do you often feel hopeless? No  Do you cry easily over simple problems? No  Activities of Daily Living In your present state of health, do you have any difficulty performing the following activities?:  Driving? No Managing money?  No Feeding yourself? No Getting from bed to chair? NoBP 123/80 mmHg  Pulse 74  Wt 179 lb (81.194 kg)  General Appearance:    Alert, cooperative, no distress, appears stated age  Head:    Normocephalic, without obvious abnormality, atraumatic  Eyes:    PERRL, conjunctiva/corneas clear, EOM's intact, fundi    benign, both eyes  Ears:    Normal TM's and external ear canals, both ears  Nose:   Nares normal, septum midline, mucosa normal, no drainage    or sinus tenderness  Throat:   Lips, mucosa, and tongue normal; teeth and gums normal  Neck:   Supple, symmetrical, trachea midline, no adenopathy;    thyroid:  no enlargement/tenderness/nodules; no carotid   bruit or JVD  Back:     Symmetric, no curvature, ROM normal, no CVA tenderness  Lungs:     Clear to auscultation bilaterally, respirations unlabored  Chest Wall:    No tenderness or deformity   Heart:    Regular rate and rhythm, S1 and S2 normal, no murmur, rub   or gallop  Breast Exam:    No tenderness, masses, or nipple abnormality  Abdomen:     Soft, non-tender, bowel sounds active all four quadrants,    no masses, no organomegaly  Genitalia:    Normal female without lesion, discharge or tenderness. Bimanual exam and PAP smear performed today without abnormalities. Uterus surgically removed.   Rectal:    Not performed.   Extremities:   Extremities normal, atraumatic, no cyanosis or edema  Pulses:   2+ and symmetric all extremities  Skin:   Skin color, texture, turgor normal, no rashes or lesions  Lymph nodes:   Cervical,  supraclavicular, and axillary nodes normal  Neurologic:   CNII-XII intact, normal strength, sensation and reflexes    throughout   Climbing a flight of stairs? No Preparing food and eating?: No Bathing or showering? No Getting dressed: No Getting to the toilet? No Using the toilet:No Moving around from place to place: No In the past year have you fallen or had a near fall?:No   Are you sexually active?  No  Do you have more than one partner?  No  Hearing  Difficulties: No Do you often ask people to speak up or repeat themselves? No Do you experience ringing or noises in your ears? No Do you have difficulty understanding soft or whispered voices? No   Do you feel that you have a problem with memory? No  Do you often misplace items? No  Do you feel safe at home?  Yes  Cognitive Testing  Alert? Yes  Normal Appearance?Yes  Oriented to person? Yes  Place? Yes   Time? Yes  Recall of three objects?  Yes  Can perform simple calculations? Yes  Displays appropriate judgment?Yes  Can read the correct time from a watch face?Yes   Advanced Directives have been discussed with the patient? Yes. Patient declines information today.   List the Names of Other Physician/Practitioners you currently use: 1.    Indicate any recent Medical Services you may have received from other than [facility] providers in the past year (date may be approximate).  Immunization History  Administered Date(s) Administered  . Influenza-Unspecified 12/11/2012  . Pneumococcal Conjugate-13 11/02/2014  . Pneumococcal Polysaccharide-23 10/10/2012  . Tdap 11/02/2014  . Zoster 03/14/2007    Screening Tests Health Maintenance  Topic Date Due  . INFLUENZA VACCINE  10/12/2014  . MAMMOGRAM  10/27/2015  . COLON CANCER SCREENING ANNUAL FOBT  11/18/2015  . COLONOSCOPY  03/13/2017  . TETANUS/TDAP  11/01/2024  . DEXA SCAN  Completed  . ZOSTAVAX  Addressed  . Hepatitis C Screening  Completed  . PNA vac Low Risk Adult   Completed    All answers were reviewed with the patient and necessary referrals were made:  BREEBACK, JADE, PA-C   12/29/2014   History reviewed: allergies, current medications, past family history, past medical history, past social history, past surgical history and problem list  Review of Systems A comprehensive review of systems was negative.    Objective:     Vision by Snellen chart: right eye:Eye exam 03/2014 - will get results, left eye:Eye exam 03/2014 - will get results  Body mass index is 27.22 kg/(m^2). BP 123/80 mmHg  Pulse 74  Wt 179 lb (81.194 kg)  See exam above.   Assessment:      Physical exam unremarkable.  LDL elevated at 143 A1C elevated at 6.2%      Plan:     During the course of the visit the patient was educated and counseled about appropriate screening and preventive services including:    Advanced directives: has NO advanced directive - not interested in additional information   Vaccines up to date.   Mammogram up to date.   Negative recent colon cancer screening.   Pap done today. Discussed can stop after 66 and hysterectomy. Pt requested to continue with pap.   Diet review for nutrition referral? Yes ____  Not Indicated _X_  Hyperlipidemia/hypertriglyeriedemia- Discussed lifestyle management for both elevated LDL and elevated A1C with patient and will re-check labs in 6 months.    Patient Instructions (the written plan) was given to the patient.  Medicare Attestation I have personally reviewed: The patient's medical and social history Their use of alcohol, tobacco or illicit drugs Their current medications and supplements The patient's functional ability including ADLs,fall risks, home safety risks, cognitive, and hearing and visual impairment Diet and physical activities Evidence for depression or mood disorders  The patient's weight, height, BMI, and visual acuity have been recorded in the chart.  I have made referrals,  counseling, and provided education to the patient based on review of the  above and I have provided the patient with a written personalized care plan for preventive services.     Iran Planas, PA-C   12/29/2014

## 2014-12-30 LAB — CYTOLOGY - PAP

## 2014-12-31 ENCOUNTER — Encounter: Payer: Self-pay | Admitting: Physician Assistant

## 2014-12-31 DIAGNOSIS — N952 Postmenopausal atrophic vaginitis: Secondary | ICD-10-CM | POA: Insufficient documentation

## 2015-03-22 ENCOUNTER — Other Ambulatory Visit: Payer: Self-pay | Admitting: *Deleted

## 2015-03-22 MED ORDER — CITALOPRAM HYDROBROMIDE 20 MG PO TABS: 20.0000 mg | ORAL_TABLET | Freq: Every day | ORAL | Status: DC

## 2015-03-22 MED ORDER — RANITIDINE HCL 150 MG PO TABS: 150.0000 mg | ORAL_TABLET | Freq: Two times a day (BID) | ORAL | Status: DC

## 2015-03-22 MED ORDER — LEVOTHYROXINE SODIUM 25 MCG PO TABS: 25.0000 ug | ORAL_TABLET | Freq: Every day | ORAL | Status: DC

## 2015-06-15 DIAGNOSIS — H2513 Age-related nuclear cataract, bilateral: Secondary | ICD-10-CM | POA: Diagnosis not present

## 2015-06-15 DIAGNOSIS — D3132 Benign neoplasm of left choroid: Secondary | ICD-10-CM | POA: Diagnosis not present

## 2015-07-24 ENCOUNTER — Other Ambulatory Visit: Payer: Self-pay | Admitting: Physician Assistant

## 2015-07-29 ENCOUNTER — Other Ambulatory Visit: Payer: Self-pay | Admitting: *Deleted

## 2015-07-29 MED ORDER — CITALOPRAM HYDROBROMIDE 20 MG PO TABS
20.0000 mg | ORAL_TABLET | Freq: Every day | ORAL | Status: DC
Start: 2015-07-29 — End: 2015-12-20

## 2015-07-29 MED ORDER — LEVOTHYROXINE SODIUM 25 MCG PO TABS: 25.0000 ug | ORAL_TABLET | Freq: Every day | ORAL | Status: DC

## 2015-08-04 ENCOUNTER — Other Ambulatory Visit: Payer: Self-pay | Admitting: Physician Assistant

## 2015-10-01 ENCOUNTER — Other Ambulatory Visit: Payer: Self-pay | Admitting: Physician Assistant

## 2015-10-01 DIAGNOSIS — Z1231 Encounter for screening mammogram for malignant neoplasm of breast: Secondary | ICD-10-CM

## 2015-10-28 ENCOUNTER — Ambulatory Visit
Admission: RE | Admit: 2015-10-28 | Discharge: 2015-10-28 | Disposition: A | Discharge status: Home / Self Care | Payer: Medicare Other | Source: Ambulatory Visit | Attending: Physician Assistant | Admitting: Physician Assistant

## 2015-10-28 DIAGNOSIS — Z1231 Encounter for screening mammogram for malignant neoplasm of breast: Secondary | ICD-10-CM

## 2015-12-20 ENCOUNTER — Other Ambulatory Visit: Payer: Self-pay | Admitting: Physician Assistant

## 2015-12-28 ENCOUNTER — Other Ambulatory Visit: Payer: Self-pay | Admitting: Physician Assistant

## 2016-01-05 ENCOUNTER — Other Ambulatory Visit (HOSPITAL_COMMUNITY): Payer: Self-pay | Admitting: Obstetrics & Gynecology

## 2016-01-05 ENCOUNTER — Encounter: Payer: Self-pay | Admitting: Obstetrics & Gynecology

## 2016-01-05 ENCOUNTER — Ambulatory Visit (INDEPENDENT_AMBULATORY_CARE_PROVIDER_SITE_OTHER): Payer: Medicare Other | Admitting: Obstetrics & Gynecology

## 2016-01-05 VITALS — BP 111/68 | HR 87 | Wt 174.0 lb

## 2016-01-05 DIAGNOSIS — E559 Vitamin D deficiency, unspecified: Secondary | ICD-10-CM | POA: Diagnosis not present

## 2016-01-05 DIAGNOSIS — M8580 Other specified disorders of bone density and structure, unspecified site: Secondary | ICD-10-CM | POA: Diagnosis not present

## 2016-01-05 DIAGNOSIS — Z23 Encounter for immunization: Secondary | ICD-10-CM

## 2016-01-05 DIAGNOSIS — E039 Hypothyroidism, unspecified: Secondary | ICD-10-CM | POA: Diagnosis not present

## 2016-01-05 DIAGNOSIS — E785 Hyperlipidemia, unspecified: Secondary | ICD-10-CM

## 2016-01-05 DIAGNOSIS — Z01419 Encounter for gynecological examination (general) (routine) without abnormal findings: Secondary | ICD-10-CM | POA: Diagnosis not present

## 2016-01-05 DIAGNOSIS — R7309 Other abnormal glucose: Secondary | ICD-10-CM | POA: Diagnosis not present

## 2016-01-05 DIAGNOSIS — R7989 Other specified abnormal findings of blood chemistry: Secondary | ICD-10-CM

## 2016-01-05 LAB — CBC
HEMATOCRIT: 41.3 % (ref 35.0–45.0)
HEMOGLOBIN: 13.6 g/dL (ref 11.7–15.5)
MCH: 30.9 pg (ref 27.0–33.0)
MCHC: 32.9 g/dL (ref 32.0–36.0)
MCV: 93.9 fL (ref 80.0–100.0)
MPV: 10.5 fL (ref 7.5–12.5)
Platelets: 295 10*3/uL (ref 140–400)
RBC: 4.4 MIL/uL (ref 3.80–5.10)
RDW: 13.1 % (ref 11.0–15.0)
WBC: 5.7 10*3/uL (ref 3.8–10.8)

## 2016-01-05 LAB — LIPID PANEL
CHOL/HDL RATIO: 3.8 ratio (ref <=5.0)
CHOLESTEROL: 226 mg/dL — AB (ref 125–200)
HDL: 59 mg/dL (ref >=46)
LDL Cholesterol: 147 mg/dL — ABNORMAL HIGH (ref <130)
Triglycerides: 100 mg/dL (ref <150)
VLDL: 20 mg/dL (ref <30)

## 2016-01-05 LAB — COMPREHENSIVE METABOLIC PANEL
ALK PHOS: 77 U/L (ref 33–130)
ALT: 15 U/L (ref 6–29)
AST: 17 U/L (ref 10–35)
Albumin: 4.3 g/dL (ref 3.6–5.1)
BUN: 19 mg/dL (ref 7–25)
CALCIUM: 9.4 mg/dL (ref 8.6–10.4)
CO2: 28 mmol/L (ref 20–31)
Chloride: 101 mmol/L (ref 98–110)
Creat: 0.81 mg/dL (ref 0.50–0.99)
Glucose, Bld: 87 mg/dL (ref 65–99)
POTASSIUM: 4.4 mmol/L (ref 3.5–5.3)
Sodium: 138 mmol/L (ref 135–146)
TOTAL PROTEIN: 7 g/dL (ref 6.1–8.1)
Total Bilirubin: 0.9 mg/dL (ref 0.2–1.2)

## 2016-01-05 LAB — TSH: TSH: 3.66 mIU/L

## 2016-01-05 NOTE — Progress Notes (Signed)
Subjective:    [name] is a 70 y.o.DW P0  female who presents for an annual exam. The patient has no complaints today. The patient is not currently sexually active. GYN screening history: last pap: was normal. The patient wears seatbelts: yes. The patient participates in regular exercise: yes. Has the patient ever been transfused or tattooed?: yes.(transfusion with hysterecomy)  The patient reports that there is not domestic violence in her life.   Menstrual History: OB History    Gravida Para Term Preterm AB Living   0 0 0 0 0 0   SAB TAB Ectopic Multiple Live Births   0 0 0 0 0      Menarche age: 61 No LMP recorded. Patient has had a hysterectomy.    The following portions of the patient's history were reviewed and updated as appropriate: allergies, current medications, past family history, past medical history, past social history, past surgical history and problem list.  Review of Systems Pertinent items are noted in HPI.   Flu vaccine today Colonoscopy UTD (due next year) Mammogram 8/17 FH- breast/gyn/colon cancer negative Works as a Engineer, manufacturing systems   Objective:    BP 111/68   Pulse 87   Wt 174 lb (78.9 kg)   BMI 26.46 kg/m   General Appearance:    Alert, cooperative, no distress, appears stated age  Head:    Normocephalic, without obvious abnormality, atraumatic  Eyes:    PERRL, conjunctiva/corneas clear, EOM's intact, fundi    benign, both eyes  Ears:    Normal TM's and external ear canals, both ears  Nose:   Nares normal, septum midline, mucosa normal, no drainage    or sinus tenderness  Throat:   Lips, mucosa, and tongue normal; teeth and gums normal  Neck:   Supple, symmetrical, trachea midline, no adenopathy;    thyroid:  no enlargement/tenderness/nodules; no carotid   bruit or JVD  Back:     Symmetric, no curvature, ROM normal, no CVA tenderness  Lungs:     Clear to auscultation bilaterally, respirations unlabored  Chest Wall:    No tenderness or deformity    Heart:    Regular rate and rhythm, S1 and S2 normal, no murmur, rub   or gallop  Breast Exam:    No tenderness, masses, or nipple abnormality  Abdomen:     Soft, non-tender, bowel sounds active all four quadrants,    no masses, no organomegaly  Genitalia:    Normal female without lesion, discharge or tenderness, moderate vulvovaginal atrophy, no prolapse, no masses with bimanual exam     Extremities:   Extremities normal, atraumatic, no cyanosis or edema  Pulses:   2+ and symmetric all extremities  Skin:   Skin color, texture, turgor normal, no rashes or lesions  Lymph nodes:   Cervical, supraclavicular, and axillary nodes normal  Neurologic:   CNII-XII intact, normal strength, sensation and reflexes    throughout   .    Assessment:    Healthy female exam.    Plan:     Breast self exam technique reviewed and patient encouraged to perform self-exam monthly. flu vaccine   Fasting labs dexa

## 2016-01-06 ENCOUNTER — Telehealth: Payer: Self-pay | Admitting: *Deleted

## 2016-01-06 LAB — HEMOGLOBIN A1C
Hgb A1c MFr Bld: 5.7 % — ABNORMAL HIGH (ref <5.7)
Mean Plasma Glucose: 117 mg/dL

## 2016-01-06 LAB — VITAMIN D 25 HYDROXY (VIT D DEFICIENCY, FRACTURES): VIT D 25 HYDROXY: 44 ng/mL (ref 30–100)

## 2016-01-06 NOTE — Telephone Encounter (Signed)
Pt notified of labwork and per pt's request she also wanted a Hgb A1C which was added on the labs.

## 2016-01-19 ENCOUNTER — Ambulatory Visit (INDEPENDENT_AMBULATORY_CARE_PROVIDER_SITE_OTHER): Payer: Medicare Other

## 2016-01-19 DIAGNOSIS — M8589 Other specified disorders of bone density and structure, multiple sites: Secondary | ICD-10-CM | POA: Diagnosis not present

## 2016-01-19 DIAGNOSIS — R7989 Other specified abnormal findings of blood chemistry: Secondary | ICD-10-CM

## 2016-01-19 DIAGNOSIS — M85852 Other specified disorders of bone density and structure, left thigh: Secondary | ICD-10-CM | POA: Diagnosis not present

## 2016-01-19 DIAGNOSIS — M8580 Other specified disorders of bone density and structure, unspecified site: Secondary | ICD-10-CM

## 2016-01-19 DIAGNOSIS — E559 Vitamin D deficiency, unspecified: Secondary | ICD-10-CM

## 2016-02-02 ENCOUNTER — Encounter: Payer: Self-pay | Admitting: Obstetrics & Gynecology

## 2016-02-02 ENCOUNTER — Ambulatory Visit (INDEPENDENT_AMBULATORY_CARE_PROVIDER_SITE_OTHER): Payer: Medicare Other | Admitting: Obstetrics & Gynecology

## 2016-02-02 VITALS — BP 126/86 | HR 93 | Resp 16 | Wt 174.0 lb

## 2016-02-02 DIAGNOSIS — M8580 Other specified disorders of bone density and structure, unspecified site: Secondary | ICD-10-CM | POA: Insufficient documentation

## 2016-02-02 NOTE — Progress Notes (Signed)
   Subjective:    Patient ID: [name], female    DOB: 03/21/1945, 70 y.o.   MRN: [id number]  HPI 70 yo lady here to discuss her DEXA results. It showed osteopenia. She has been taking calcium for years along with Vit d, weight bearing exercise. Her vit d and calcium levels were normal last month.   Review of Systems  She had a hysterectomy in 1991 for fibroids.     Objective:   Physical Exam [id number] Breathing, conversing, and ambulating     Assessment & Dean:  Osteopenia- discussed all the options, including risks and benefits.for treatment She will consider her options and let me know

## 2016-02-09 ENCOUNTER — Telehealth: Payer: Self-pay | Admitting: *Deleted

## 2016-02-09 DIAGNOSIS — Z780 Asymptomatic menopausal state: Secondary | ICD-10-CM

## 2016-02-09 DIAGNOSIS — M8580 Other specified disorders of bone density and structure, unspecified site: Secondary | ICD-10-CM

## 2016-02-09 MED ORDER — ESTRADIOL 1 MG PO TABS: 1.0000 mg | ORAL_TABLET | Freq: Every day | ORAL | 12 refills | Status: DC

## 2016-02-09 NOTE — Telephone Encounter (Signed)
Pt has decided to go on HRT for osteopenia.  Spoke with Dr Hulan Fray who recommended Estradiol 1 mg daily.  This was escribed to [facility].

## 2016-03-14 DIAGNOSIS — K045 Chronic apical periodontitis: Secondary | ICD-10-CM | POA: Diagnosis not present

## 2016-04-21 ENCOUNTER — Other Ambulatory Visit: Payer: Self-pay | Admitting: Physician Assistant

## 2016-05-09 ENCOUNTER — Other Ambulatory Visit: Payer: Self-pay | Admitting: Physician Assistant

## 2016-05-29 ENCOUNTER — Telehealth: Payer: Self-pay | Admitting: *Deleted

## 2016-05-29 DIAGNOSIS — M8580 Other specified disorders of bone density and structure, unspecified site: Secondary | ICD-10-CM

## 2016-05-29 DIAGNOSIS — E039 Hypothyroidism, unspecified: Secondary | ICD-10-CM

## 2016-05-29 DIAGNOSIS — Z780 Asymptomatic menopausal state: Secondary | ICD-10-CM

## 2016-05-29 DIAGNOSIS — K219 Gastro-esophageal reflux disease without esophagitis: Secondary | ICD-10-CM

## 2016-05-29 MED ORDER — RANITIDINE HCL 150 MG PO TABS: 150.0000 mg | ORAL_TABLET | Freq: Two times a day (BID) | ORAL | 3 refills | Status: DC

## 2016-05-29 MED ORDER — ESTRADIOL 1 MG PO TABS: 1.0000 mg | ORAL_TABLET | Freq: Every day | ORAL | 12 refills | Status: DC

## 2016-05-29 MED ORDER — LEVOTHYROXINE SODIUM 25 MCG PO TABS: 25.0000 ug | ORAL_TABLET | Freq: Every day | ORAL | 3 refills | Status: DC

## 2016-05-29 MED ORDER — CITALOPRAM HYDROBROMIDE 20 MG PO TABS: 20.0000 mg | ORAL_TABLET | Freq: Every day | ORAL | 3 refills | Status: DC

## 2016-05-29 NOTE — Telephone Encounter (Signed)
Pt called requesting a RF on her Celexa, Synthroid,Zantac and Estradiol.  She used to get these from PCP but doesn't care to return there and pt spoke with Dr Hulan Fray about [place].  Meds were sent to [facility]

## 2016-08-11 DIAGNOSIS — H1045 Other chronic allergic conjunctivitis: Secondary | ICD-10-CM | POA: Diagnosis not present

## 2016-08-11 DIAGNOSIS — D3132 Benign neoplasm of left choroid: Secondary | ICD-10-CM | POA: Diagnosis not present

## 2016-08-11 DIAGNOSIS — H52223 Regular astigmatism, bilateral: Secondary | ICD-10-CM | POA: Diagnosis not present

## 2016-08-11 DIAGNOSIS — H5211 Myopia, right eye: Secondary | ICD-10-CM | POA: Diagnosis not present

## 2016-08-11 DIAGNOSIS — H524 Presbyopia: Secondary | ICD-10-CM | POA: Diagnosis not present

## 2016-08-11 DIAGNOSIS — H2513 Age-related nuclear cataract, bilateral: Secondary | ICD-10-CM | POA: Diagnosis not present

## 2016-08-11 DIAGNOSIS — H04123 Dry eye syndrome of bilateral lacrimal glands: Secondary | ICD-10-CM | POA: Diagnosis not present

## 2016-09-21 ENCOUNTER — Other Ambulatory Visit (HOSPITAL_COMMUNITY): Payer: Self-pay | Admitting: Obstetrics & Gynecology

## 2016-09-21 DIAGNOSIS — Z1231 Encounter for screening mammogram for malignant neoplasm of breast: Secondary | ICD-10-CM

## 2016-09-29 ENCOUNTER — Other Ambulatory Visit: Payer: Self-pay

## 2016-10-02 DIAGNOSIS — L821 Other seborrheic keratosis: Secondary | ICD-10-CM | POA: Diagnosis not present

## 2016-10-02 DIAGNOSIS — D2272 Melanocytic nevi of left lower limb, including hip: Secondary | ICD-10-CM | POA: Diagnosis not present

## 2016-10-30 ENCOUNTER — Ambulatory Visit
Admission: RE | Admit: 2016-10-30 | Discharge: 2016-10-30 | Disposition: A | Discharge status: Home / Self Care | Payer: Medicare Other | Source: Ambulatory Visit | Attending: Obstetrics & Gynecology | Admitting: Obstetrics & Gynecology

## 2016-10-30 DIAGNOSIS — Z1231 Encounter for screening mammogram for malignant neoplasm of breast: Secondary | ICD-10-CM

## 2016-12-06 DIAGNOSIS — Z23 Encounter for immunization: Secondary | ICD-10-CM | POA: Diagnosis not present

## 2017-01-10 ENCOUNTER — Ambulatory Visit (INDEPENDENT_AMBULATORY_CARE_PROVIDER_SITE_OTHER): Payer: Medicare Other | Admitting: Obstetrics & Gynecology

## 2017-01-10 ENCOUNTER — Encounter: Payer: Self-pay | Admitting: Obstetrics & Gynecology

## 2017-01-10 VITALS — BP 128/82 | HR 80 | Ht 67.0 in | Wt 177.0 lb

## 2017-01-10 DIAGNOSIS — E559 Vitamin D deficiency, unspecified: Secondary | ICD-10-CM | POA: Diagnosis not present

## 2017-01-10 DIAGNOSIS — Z01419 Encounter for gynecological examination (general) (routine) without abnormal findings: Secondary | ICD-10-CM | POA: Diagnosis not present

## 2017-01-10 DIAGNOSIS — R7303 Prediabetes: Secondary | ICD-10-CM | POA: Diagnosis not present

## 2017-01-10 DIAGNOSIS — E785 Hyperlipidemia, unspecified: Secondary | ICD-10-CM | POA: Diagnosis not present

## 2017-01-10 DIAGNOSIS — E039 Hypothyroidism, unspecified: Secondary | ICD-10-CM | POA: Diagnosis not present

## 2017-01-10 DIAGNOSIS — Z780 Asymptomatic menopausal state: Secondary | ICD-10-CM

## 2017-01-10 MED ORDER — CITALOPRAM HYDROBROMIDE 40 MG PO TABS: 20.0000 mg | ORAL_TABLET | Freq: Every day | ORAL | 6 refills | Status: DC

## 2017-01-10 NOTE — Progress Notes (Signed)
Subjective:    [name] is a 71 y.o. female who presents for an annual exam. She is feeling more anxious with the 20 mg of celexa, previously on a higher dose.  The patient is not currently sexually active. GYN screening history: last pap: was normal. The patient wears seatbelts: yes. The patient participates in regular exercise: yes. Has the patient ever been transfused or tattooed?: yes. The patient reports that there is not domestic violence in her life.   Menstrual History: OB History    Gravida Para Term Preterm AB Living   0 0 0 0 0 0   SAB TAB Ectopic Multiple Live Births   0 0 0 0 0      Menarche age: 56 No LMP recorded. Patient has had a hysterectomy.    The following portions of the patient's history were reviewed and updated as appropriate: allergies, current medications, past family history, past medical history, past social history, past surgical history and problem list.  Review of Systems Pertinent items are noted in HPI.   She had a colonoscopy at 71 yo She had a flu vaccine this season. FH- no breast/gyn/colon cancer    Objective:    BP 128/82   Pulse 80   Ht 5\' 7"  (1.702 m)   Wt 177 lb (80.3 kg)   BMI 27.72 kg/m   General Appearance:    Alert, cooperative, no distress, appears stated age  Head:    Normocephalic, without obvious abnormality, atraumatic  Eyes:    PERRL, conjunctiva/corneas clear, EOM's intact, fundi    benign, both eyes  Ears:    Normal TM's and external ear canals, both ears  Nose:   Nares normal, septum midline, mucosa normal, no drainage    or sinus tenderness  Throat:   Lips, mucosa, and tongue normal; teeth and gums normal  Neck:   Supple, symmetrical, trachea midline, no adenopathy;    thyroid:  no enlargement/tenderness/nodules; no carotid   bruit or JVD  Back:     Symmetric, no curvature, ROM normal, no CVA tenderness  Lungs:     Clear to auscultation bilaterally, respirations unlabored  Chest Wall:    No tenderness or  deformity   Heart:    Regular rate and rhythm, S1 and S2 normal, no murmur, rub   or gallop  Breast Exam:    No tenderness, masses, or nipple abnormality  Abdomen:     Soft, non-tender, bowel sounds active all four quadrants,    no masses, no organomegaly  Genitalia:    Normal female without lesion, discharge or tenderness     Extremities:   Extremities normal, atraumatic, no cyanosis or edema  Pulses:   2+ and symmetric all extremities  Skin:   Skin color, texture, turgor normal, no rashes or lesions  Lymph nodes:   Cervical, supraclavicular, and axillary nodes normal  Neurologic:   CNII-XII intact, normal strength, sensation and reflexes    throughout  .    Assessment:    Healthy female exam.    Plan:     Change celexa to 40 mg.   She will see Dr. Sheppard Coil re vaccinations

## 2017-01-11 ENCOUNTER — Telehealth: Payer: Self-pay

## 2017-01-11 LAB — COMPREHENSIVE METABOLIC PANEL
AG RATIO: 1.9 (calc) (ref 1.0–2.5)
ALT: 16 U/L (ref 6–29)
AST: 19 U/L (ref 10–35)
Albumin: 4.5 g/dL (ref 3.6–5.1)
Alkaline phosphatase (APISO): 72 U/L (ref 33–130)
BUN: 16 mg/dL (ref 7–25)
CO2: 26 mmol/L (ref 20–32)
CREATININE: 0.72 mg/dL (ref 0.50–0.99)
Calcium: 9.2 mg/dL (ref 8.6–10.4)
Chloride: 102 mmol/L (ref 98–110)
GLUCOSE: 92 mg/dL (ref 65–99)
Globulin: 2.4 g/dL (calc) (ref 1.9–3.7)
Potassium: 4.2 mmol/L (ref 3.5–5.3)
SODIUM: 137 mmol/L (ref 135–146)
TOTAL PROTEIN: 6.9 g/dL (ref 6.1–8.1)
Total Bilirubin: 1 mg/dL (ref 0.2–1.2)

## 2017-01-11 LAB — HEMOGLOBIN A1C
EAG (MMOL/L): 6.8 (calc)
Hgb A1c MFr Bld: 5.9 % of total Hgb — ABNORMAL HIGH (ref <5.7)
Mean Plasma Glucose: 123 (calc)

## 2017-01-11 LAB — CBC
HCT: 36.4 % (ref 35.0–45.0)
HEMOGLOBIN: 12.8 g/dL (ref 11.7–15.5)
MCH: 31.8 pg (ref 27.0–33.0)
MCHC: 35.2 g/dL (ref 32.0–36.0)
MCV: 90.3 fL (ref 80.0–100.0)
MPV: 10.8 fL (ref 7.5–12.5)
PLATELETS: 255 10*3/uL (ref 140–400)
RBC: 4.03 10*6/uL (ref 3.80–5.10)
RDW: 11.9 % (ref 11.0–15.0)
WBC: 5.8 10*3/uL (ref 3.8–10.8)

## 2017-01-11 LAB — LIPID PANEL
CHOL/HDL RATIO: 3.1 (calc) (ref <5.0)
CHOLESTEROL: 225 mg/dL — AB (ref <200)
HDL: 72 mg/dL (ref >50)
LDL CHOLESTEROL (CALC): 130 mg/dL — AB
Non-HDL Cholesterol (Calc): 153 mg/dL (calc) — ABNORMAL HIGH (ref <130)
Triglycerides: 122 mg/dL (ref <150)

## 2017-01-11 LAB — VITAMIN D 25 HYDROXY (VIT D DEFICIENCY, FRACTURES): VIT D 25 HYDROXY: 48 ng/mL (ref 30–100)

## 2017-01-11 LAB — TSH: TSH: 3.61 mIU/L (ref 0.40–4.50)

## 2017-01-11 NOTE — Telephone Encounter (Signed)
Left message on pt's voicemail letting her know that Dr.Dove recommends for her to see her family doctor about her lipids

## 2017-01-15 ENCOUNTER — Encounter: Payer: Self-pay | Admitting: Osteopathic Medicine

## 2017-01-15 ENCOUNTER — Ambulatory Visit (INDEPENDENT_AMBULATORY_CARE_PROVIDER_SITE_OTHER): Payer: Medicare Other | Admitting: Osteopathic Medicine

## 2017-01-15 VITALS — BP 135/79 | HR 76 | Temp 97.7°F | Ht 67.0 in | Wt 183.2 lb

## 2017-01-15 DIAGNOSIS — R7303 Prediabetes: Secondary | ICD-10-CM | POA: Insufficient documentation

## 2017-01-15 DIAGNOSIS — Z23 Encounter for immunization: Secondary | ICD-10-CM | POA: Diagnosis not present

## 2017-01-15 DIAGNOSIS — Z1211 Encounter for screening for malignant neoplasm of colon: Secondary | ICD-10-CM | POA: Diagnosis not present

## 2017-01-15 DIAGNOSIS — R229 Localized swelling, mass and lump, unspecified: Secondary | ICD-10-CM | POA: Diagnosis not present

## 2017-01-15 DIAGNOSIS — E7800 Pure hypercholesterolemia, unspecified: Secondary | ICD-10-CM

## 2017-01-15 MED ORDER — ZOSTER VAC RECOMB ADJUVANTED 50 MCG/0.5ML IM SUSR: 0.5000 mL | Freq: Once | INTRAMUSCULAR | 1 refills | Status: AC

## 2017-01-15 NOTE — Progress Notes (Signed)
HPI: [name] is a 71 y.o. female with PMH  has a past medical history of Depression, Hyperlipidemia, Menopause, and Thyroid disease.  who presents to [facility] today, 01/15/17,  for chief complaint of:  Chief Complaint  Patient presents with  . Establish Care    needs PCP - few questions today but no complaints     Colon cancer screening - normal colonoscopy 9 years ago, would like to consider cologuard   Shingles vaccine - had Zostavax but would like Shingrix  Elevated sugars - recent A1C 5.9, was 5.7 last year  Elevated cholesterol - good HDL level, LDL borderline. No heart disease, nonsmoker.   Admits diet could be better. Exercises regularly.   Skin lesion - seen by derm, not concerned about malignancy/dangerous lesion but spot on back of knee is bothering her      Past medical, surgical, social and family history reviewed:  Patient Active Problem List   Diagnosis Date Noted  . Osteopenia determined by x-ray 02/02/2016  . Vaginal atrophy 12/31/2014  . Esophageal reflux 11/02/2014  . Hematuria 10/15/2013  . FH: kidney cancer  mother 10/15/2013  . Osteopenia 10/16/2012  . Vaginal high risk HPV DNA test positive  repeat 2014  neg  09/13/2011  . History of depression 07/31/2011  . H/O: hysterectomy 07/31/2011  . Hypothyroidism 06/20/2011  . Hyperlipidemia 06/20/2011  . Anxiety 06/20/2011  . VERTIGO 03/16/2009    Past Surgical History:  Procedure Laterality Date  . ABDOMINAL HYSTERECTOMY      Social History   Tobacco Use  . Smoking status: Never Smoker  . Smokeless tobacco: Never Used  Substance Use Topics  . Alcohol use: Yes    Alcohol/week: 0.5 oz    Types: 1 drink(s) per week    Family History  Problem Relation Age of Onset  . Heart disease Mother   . Diabetes Mother   . Heart disease Father      Current medication list and allergy/intolerance information reviewed:    Current Outpatient Medications   Medication Sig Dispense Refill  . aspirin 81 MG EC tablet Take 81 mg by mouth daily.      . Black Cohosh 40 MG CAPS Take by mouth daily.    . Calcium Carbonate-Vit D-Min (CALCIUM 1200 PO) Take 2 tablets by mouth daily.     . cholecalciferol (VITAMIN D) 1000 UNITS tablet Take 1,000 Units by mouth daily.      . citalopram (CELEXA) 40 MG tablet Take 0.5 tablets (20 mg total) by mouth daily. Take 1 tab PO daily 90 tablet 6  . estradiol (ESTRACE) 1 MG tablet Take 1 tablet (1 mg total) by mouth daily. 31 tablet 12  . fish oil-omega-3 fatty acids 1000 MG capsule Take 1 capsule by mouth daily.    . Flaxseed, Linseed, (FLAX SEED OIL) 1000 MG CAPS Take 1 capsule by mouth daily.    Marland Kitchen GARLIC PO Take 1 capsule by mouth.    . levothyroxine (SYNTHROID, LEVOTHROID) 25 MCG tablet Take 1 tablet (25 mcg total) by mouth daily. 90 tablet 3  . Multiple Vitamin (MULTIVITAMIN) tablet Take 1 tablet by mouth daily.    . Multiple Vitamins-Minerals (HAIR/SKIN/NAILS PO) Take by mouth daily.    . ranitidine (ZANTAC) 150 MG tablet Take 1 tablet (150 mg total) by mouth 2 (two) times daily. 180 tablet 3   No current facility-administered medications for this visit.     Allergies  Allergen Reactions  . Penicillins  Review of Systems:  Constitutional:  No  fever, no chills, No recent illness  HEENT: No  headache, no vision change, no hearing change  Cardiac: No  chest pain, No  pressure, No palpitations  Respiratory:  No  shortness of breath. No  Cough  Gastrointestinal: No  abdominal pain, No  nausea, No  vomiting,  No  blood in stool, No  diarrhea, No  constipation   Musculoskeletal: No new myalgia/arthralgia  Genitourinary: No  incontinence, No  abnormal genital bleeding, No abnormal genital discharge  Skin: No  Rash, +other wounds/concerning lesions  Hem/Onc: No  easy bruising/bleeding  Endocrine: No cold intolerance,  No heat intolerance.  Neurologic: No  weakness, No  dizziness, No  slurred  speech/focal weakness/facial droop  Psychiatric: No  concerns with depression, No  concerns with anxiety, No sleep problems, No mood problems  Exam:  BP 135/79   Pulse 76   Temp 97.7 F (36.5 C)   Ht 5\' 7"  (1.702 m)   Wt 183 lb 3.2 oz (83.1 kg)   SpO2 99%   BMI 28.69 kg/m   Constitutional: VS see above. General Appearance: alert, well-developed, well-nourished, NAD  Eyes: Normal lids and conjunctive, non-icteric sclera  Ears, Nose, Mouth, Throat: MMM, Normal external inspection ears/nares/mouth/lips/gums. TM normal bilaterally. Pharynx/tonsils no erythema, no exudate. Nasal mucosa normal.   Neck: No masses, trachea midline. No thyroid enlargement. No tenderness/mass appreciated. No lymphadenopathy  Respiratory: Normal respiratory effort. no wheeze, no rhonchi, no rales  Cardiovascular: S1/S2 normal, no murmur, no rub/gallop auscultated. RRR. No lower extremity edema.   Gastrointestinal: Nontender, no masses.   Musculoskeletal: Gait normal  Neurological: Normal balance/coordination. No tremor.   Skin: warm, dry, intact. No rash/ulcer. Pea-sized nodule on posterior knee fossa on L   Psychiatric: Normal judgment/insight. Normal mood and affect. Oriented x3.        ASSESSMENT/PLAN:   Colon cancer screening - Plan: Cologuard  Need for shingles vaccine - Plan: Varicella-zoster vaccine IM (Shingrix)  Prediabetes - discussed diet/lifestyle modifications, would like to hold off on Rx for now  Elevated cholesterol  Skin nodule - appears benign, no liquid w/ freezing and 18G needle, will RTC for shave biopsy removal      Visit summary with medication list and pertinent instructions was printed for patient to review. All questions at time of visit were answered - patient instructed to contact office with any additional concerns. ER/RTC precautions were reviewed with the patient. Follow-up plan: Return for skin lesion removal at your convenience, recheck sugars 6 months  .  Note: Total time spent 30 minutes, greater than 50% of the visit was spent face-to-face counseling and coordinating care for the following: The primary encounter diagnosis was Colon cancer screening. Diagnoses of Need for shingles vaccine, Prediabetes, Elevated cholesterol, and Skin nodule were also pertinent to this visit.Marland Kitchen  Please note: voice recognition software was used to produce this document, and typos may escape review. Please contact me for any needed clarifications.

## 2017-01-18 ENCOUNTER — Ambulatory Visit (INDEPENDENT_AMBULATORY_CARE_PROVIDER_SITE_OTHER): Payer: Medicare Other | Admitting: Osteopathic Medicine

## 2017-01-18 ENCOUNTER — Encounter: Payer: Self-pay | Admitting: Osteopathic Medicine

## 2017-01-18 VITALS — BP 134/77 | HR 70 | Temp 97.7°F | Wt 181.5 lb

## 2017-01-18 DIAGNOSIS — R229 Localized swelling, mass and lump, unspecified: Secondary | ICD-10-CM

## 2017-01-18 DIAGNOSIS — D2372 Other benign neoplasm of skin of left lower limb, including hip: Secondary | ICD-10-CM | POA: Diagnosis not present

## 2017-01-18 DIAGNOSIS — C4491 Basal cell carcinoma of skin, unspecified: Secondary | ICD-10-CM

## 2017-01-18 HISTORY — DX: Basal cell carcinoma of skin, unspecified: C44.91

## 2017-01-18 NOTE — Addendum Note (Signed)
Addended by: Tommas Olp B on: 01/18/2017 03:03 PM   Modules accepted: Orders

## 2017-01-18 NOTE — Progress Notes (Signed)
HPI: [name] is a 71 y.o. female who  has a past medical history of Depression, Hyperlipidemia, Menopause, and Thyroid disease.  she presents to [facility] today, 01/18/17,  for chief complaint of:  Chief Complaint  Patient presents with  . Biopsy    back of leg     Here for skin lesion removal - irritating and desires definitive diagnosis.    Past medical history, surgical history, social history and family history reviewed.  Patient Active Problem List   Diagnosis Date Noted  . Prediabetes 01/15/2017  . Osteopenia determined by x-ray 02/02/2016  . Vaginal atrophy 12/31/2014  . Esophageal reflux 11/02/2014  . Hematuria 10/15/2013  . FH: kidney cancer  mother 10/15/2013  . Osteopenia 10/16/2012  . Vaginal high risk HPV DNA test positive  repeat 2014  neg  09/13/2011  . History of depression 07/31/2011  . H/O: hysterectomy 07/31/2011  . Hypothyroidism 06/20/2011  . Hyperlipidemia 06/20/2011  . Anxiety 06/20/2011  . VERTIGO 03/16/2009    Current medication list and allergy/intolerance information reviewed.    Current Outpatient Medications on File Prior to Visit  Medication Sig Dispense Refill  . aspirin 81 MG EC tablet Take 81 mg by mouth daily.      . Black Cohosh 40 MG CAPS Take by mouth daily.    . Calcium Carbonate-Vit D-Min (CALCIUM 1200 PO) Take 2 tablets by mouth daily.     . cholecalciferol (VITAMIN D) 1000 UNITS tablet Take 1,000 Units by mouth daily.      . Cyanocobalamin (B-12) 2500 MCG TABS Take 2 tablets daily by mouth.    . estradiol (ESTRACE) 1 MG tablet Take 1 tablet (1 mg total) by mouth daily. 31 tablet 12  . fish oil-omega-3 fatty acids 1000 MG capsule Take 1 capsule by mouth daily.    . Flaxseed, Linseed, (FLAX SEED OIL) 1000 MG CAPS Take 1 capsule by mouth daily.    Marland Kitchen GARLIC PO Take 1 capsule by mouth.    . levothyroxine (SYNTHROID, LEVOTHROID) 25 MCG tablet Take 1 tablet (25 mcg total) by mouth daily. 90  tablet 3  . Multiple Vitamin (MULTIVITAMIN) tablet Take 1 tablet by mouth daily.    . Multiple Vitamins-Minerals (HAIR/SKIN/NAILS PO) Take by mouth daily.    . ranitidine (ZANTAC) 150 MG tablet Take 1 tablet (150 mg total) by mouth 2 (two) times daily. 180 tablet 3  . citalopram (CELEXA) 20 MG tablet Take 2 tablets daily by mouth.    . citalopram (CELEXA) 40 MG tablet Take 0.5 tablets (20 mg total) by mouth daily. Take 1 tab PO daily (Patient taking differently: Take 40 mg daily by mouth. Take 1 tab PO daily) 90 tablet 6   No current facility-administered medications on file prior to visit.    No Active Allergies    Review of Systems:  Constitutional: No recent illness, feels well today   Skin: +bump on skin as per HPI   Exam:  BP 134/77 (BP Location: Left Arm, Patient Position: Sitting, Cuff Size: Large)   Pulse 70   Temp 97.7 F (36.5 C) (Oral)   Wt 181 lb 8 oz (82.3 kg)   BMI 28.43 kg/m   Constitutional: VS see above. General Appearance: alert, well-developed, well-nourished, NAD  Skin: warm, dry, intact. 1 cm base pink smooth nodule on L popliteal fossa  Psychiatric: Normal judgment/insight. Normal mood and affect. Oriented x3.      ASSESSMENT/PLAN:   Skin nodule  PRE-OP DIAGNOSIS: Abnormal Skin Lesion - likely benign nodule of L popliteal area POST-OP DIAGNOSIS: Same  PROCEDURE: shave skin biopsy Performing Physician: Emeterio Reeve  Informed consent obtained verbally after review risks/benefits    The area surrounding the skin lesion was prepared and draped in the usual sterile manner. Anesthesia w. 1cc Xylocaine w/ Epi. The lesion was removed in the usual manner by the biopsy method noted above. Hemostasis was assured with silver nitrate. The patient tolerated the procedure well.    Closure:       None needed   Followup: The patient tolerated the procedure well without complications.  Standard post-procedure care is explained and return precautions  are given.   Meds ordered this encounter  Medications  . citalopram (CELEXA) 20 MG tablet    Sig: Take 2 tablets daily by mouth.      Follow-up plan: Return in about 6 months (around 07/18/2017) for recheck labs, annual physical .  Visit summary with medication list and pertinent instructions was printed for patient to review, alert [place] if any changes needed. All questions at time of visit were answered - patient instructed to contact office with any additional concerns. ER/RTC precautions were reviewed with the patient and understanding verbalized.    Please note: voice recognition software was used to produce this document, and typos may escape review. Please contact Dr. Sheppard Coil for any needed clarifications.

## 2017-01-23 ENCOUNTER — Telehealth: Payer: Self-pay | Admitting: *Deleted

## 2017-01-23 DIAGNOSIS — Z780 Asymptomatic menopausal state: Secondary | ICD-10-CM

## 2017-01-23 MED ORDER — CITALOPRAM HYDROBROMIDE 40 MG PO TABS: 40.0000 mg | ORAL_TABLET | Freq: Every day | ORAL | 6 refills | Status: DC

## 2017-01-23 NOTE — Telephone Encounter (Signed)
Pt called requesting that her Celexa be sent to [facility] instead of local [facility].

## 2017-01-30 ENCOUNTER — Telehealth: Payer: Self-pay

## 2017-01-30 NOTE — Telephone Encounter (Signed)
Faxed Cologuard form to [employer] at [phone]

## 2017-02-09 ENCOUNTER — Other Ambulatory Visit: Payer: Self-pay | Admitting: Osteopathic Medicine

## 2017-02-09 DIAGNOSIS — C44711 Basal cell carcinoma of skin of unspecified lower limb, including hip: Secondary | ICD-10-CM

## 2017-02-27 DIAGNOSIS — Z1211 Encounter for screening for malignant neoplasm of colon: Secondary | ICD-10-CM | POA: Diagnosis not present

## 2017-02-27 DIAGNOSIS — Z1212 Encounter for screening for malignant neoplasm of rectum: Secondary | ICD-10-CM | POA: Diagnosis not present

## 2017-03-09 ENCOUNTER — Telehealth: Payer: Self-pay | Admitting: Osteopathic Medicine

## 2017-03-09 LAB — COLOGUARD: COLOGUARD: NEGATIVE

## 2017-03-09 NOTE — Telephone Encounter (Signed)
Please call patient: I have received results from cologuard, testing was negative, plan to repeat in 3 years 

## 2017-03-12 NOTE — Telephone Encounter (Signed)
Pt advised of results, verbalized understanding. No further questions.  

## 2017-03-23 ENCOUNTER — Encounter: Payer: Self-pay | Admitting: Osteopathic Medicine

## 2017-03-27 ENCOUNTER — Other Ambulatory Visit: Payer: Self-pay | Admitting: Physician Assistant

## 2017-03-27 DIAGNOSIS — C44719 Basal cell carcinoma of skin of left lower limb, including hip: Secondary | ICD-10-CM | POA: Diagnosis not present

## 2017-03-27 DIAGNOSIS — C4491 Basal cell carcinoma of skin, unspecified: Secondary | ICD-10-CM

## 2017-03-27 HISTORY — DX: Basal cell carcinoma of skin, unspecified: C44.91

## 2017-04-18 DIAGNOSIS — C44719 Basal cell carcinoma of skin of left lower limb, including hip: Secondary | ICD-10-CM | POA: Diagnosis not present

## 2017-05-02 DIAGNOSIS — C44719 Basal cell carcinoma of skin of left lower limb, including hip: Secondary | ICD-10-CM | POA: Diagnosis not present

## 2017-05-02 DIAGNOSIS — L905 Scar conditions and fibrosis of skin: Secondary | ICD-10-CM | POA: Diagnosis not present

## 2017-05-16 DIAGNOSIS — Z85828 Personal history of other malignant neoplasm of skin: Secondary | ICD-10-CM | POA: Diagnosis not present

## 2017-05-16 DIAGNOSIS — L579 Skin changes due to chronic exposure to nonionizing radiation, unspecified: Secondary | ICD-10-CM | POA: Diagnosis not present

## 2017-05-16 DIAGNOSIS — L814 Other melanin hyperpigmentation: Secondary | ICD-10-CM | POA: Diagnosis not present

## 2017-05-16 DIAGNOSIS — Z4802 Encounter for removal of sutures: Secondary | ICD-10-CM | POA: Diagnosis not present

## 2017-05-16 DIAGNOSIS — D1801 Hemangioma of skin and subcutaneous tissue: Secondary | ICD-10-CM | POA: Diagnosis not present

## 2017-05-30 ENCOUNTER — Other Ambulatory Visit: Payer: Self-pay | Admitting: Obstetrics & Gynecology

## 2017-05-30 DIAGNOSIS — M8580 Other specified disorders of bone density and structure, unspecified site: Secondary | ICD-10-CM

## 2017-05-30 DIAGNOSIS — Z780 Asymptomatic menopausal state: Secondary | ICD-10-CM

## 2017-05-31 ENCOUNTER — Telehealth: Payer: Self-pay | Admitting: Obstetrics & Gynecology

## 2017-05-31 NOTE — Telephone Encounter (Signed)
Pt called states she has questions about her test results from 01/10/2017. Please call pt [phone].

## 2017-06-01 NOTE — Telephone Encounter (Signed)
Returning pt call about labs.  LM on voicemail for pt to call me back.

## 2017-06-11 ENCOUNTER — Other Ambulatory Visit: Payer: Self-pay | Admitting: Obstetrics & Gynecology

## 2017-06-11 DIAGNOSIS — K219 Gastro-esophageal reflux disease without esophagitis: Secondary | ICD-10-CM

## 2017-06-11 DIAGNOSIS — E039 Hypothyroidism, unspecified: Secondary | ICD-10-CM

## 2017-07-18 ENCOUNTER — Telehealth: Payer: Self-pay | Admitting: *Deleted

## 2017-07-18 NOTE — Telephone Encounter (Signed)
Error

## 2017-07-18 NOTE — Telephone Encounter (Signed)
Pt called and stated that Medicare nor her supplement covered her visit on 01/10/17. They stated that her visit was coded incorrectly and needs to be changed for them to pay. Pt would like to know when the claim is resubmitted for payment.

## 2017-07-24 ENCOUNTER — Encounter: Payer: Self-pay | Admitting: Osteopathic Medicine

## 2017-07-24 DIAGNOSIS — L989 Disorder of the skin and subcutaneous tissue, unspecified: Secondary | ICD-10-CM | POA: Insufficient documentation

## 2017-08-09 NOTE — Telephone Encounter (Signed)
Please send this info to someone who is better aware of how to handle a coding issue, perhaps start with [place].

## 2017-09-20 ENCOUNTER — Other Ambulatory Visit (HOSPITAL_COMMUNITY): Payer: Self-pay | Admitting: Obstetrics & Gynecology

## 2017-09-20 ENCOUNTER — Other Ambulatory Visit: Payer: Self-pay | Admitting: Osteopathic Medicine

## 2017-09-20 DIAGNOSIS — Z1231 Encounter for screening mammogram for malignant neoplasm of breast: Secondary | ICD-10-CM

## 2017-10-31 ENCOUNTER — Ambulatory Visit
Admission: RE | Admit: 2017-10-31 | Discharge: 2017-10-31 | Disposition: A | Discharge status: Home / Self Care | Payer: Medicare Other | Source: Ambulatory Visit | Attending: Osteopathic Medicine | Admitting: Osteopathic Medicine

## 2017-10-31 DIAGNOSIS — Z1231 Encounter for screening mammogram for malignant neoplasm of breast: Secondary | ICD-10-CM

## 2017-11-13 DIAGNOSIS — H02413 Mechanical ptosis of bilateral eyelids: Secondary | ICD-10-CM | POA: Diagnosis not present

## 2017-11-18 DIAGNOSIS — Z23 Encounter for immunization: Secondary | ICD-10-CM | POA: Diagnosis not present

## 2017-11-20 DIAGNOSIS — D225 Melanocytic nevi of trunk: Secondary | ICD-10-CM | POA: Diagnosis not present

## 2017-11-20 DIAGNOSIS — L814 Other melanin hyperpigmentation: Secondary | ICD-10-CM | POA: Diagnosis not present

## 2017-11-20 DIAGNOSIS — D1801 Hemangioma of skin and subcutaneous tissue: Secondary | ICD-10-CM | POA: Diagnosis not present

## 2017-11-20 DIAGNOSIS — Z85828 Personal history of other malignant neoplasm of skin: Secondary | ICD-10-CM | POA: Diagnosis not present

## 2017-11-20 DIAGNOSIS — L579 Skin changes due to chronic exposure to nonionizing radiation, unspecified: Secondary | ICD-10-CM | POA: Diagnosis not present

## 2017-11-26 DIAGNOSIS — H02411 Mechanical ptosis of right eyelid: Secondary | ICD-10-CM | POA: Diagnosis not present

## 2017-11-26 DIAGNOSIS — H5789 Other specified disorders of eye and adnexa: Secondary | ICD-10-CM | POA: Diagnosis not present

## 2017-11-26 DIAGNOSIS — H02413 Mechanical ptosis of bilateral eyelids: Secondary | ICD-10-CM | POA: Diagnosis not present

## 2017-11-26 DIAGNOSIS — H02412 Mechanical ptosis of left eyelid: Secondary | ICD-10-CM | POA: Diagnosis not present

## 2018-01-21 ENCOUNTER — Encounter: Payer: Medicare Other | Admitting: Osteopathic Medicine

## 2018-01-28 ENCOUNTER — Telehealth: Payer: Self-pay | Admitting: *Deleted

## 2018-01-28 NOTE — Telephone Encounter (Signed)
Pt request that her lab bill resubmitted from services from 10/18

## 2018-01-29 ENCOUNTER — Ambulatory Visit: Payer: Medicare Other | Admitting: Osteopathic Medicine

## 2018-02-12 ENCOUNTER — Ambulatory Visit: Payer: Medicare Other | Admitting: Osteopathic Medicine

## 2018-02-13 ENCOUNTER — Ambulatory Visit (INDEPENDENT_AMBULATORY_CARE_PROVIDER_SITE_OTHER): Payer: Medicare Other | Admitting: Osteopathic Medicine

## 2018-02-13 ENCOUNTER — Encounter: Payer: Self-pay | Admitting: Osteopathic Medicine

## 2018-02-13 VITALS — BP 125/80 | HR 76 | Temp 98.1°F | Wt 179.4 lb

## 2018-02-13 DIAGNOSIS — R7303 Prediabetes: Secondary | ICD-10-CM | POA: Diagnosis not present

## 2018-02-13 DIAGNOSIS — Z0000 Encounter for general adult medical examination without abnormal findings: Secondary | ICD-10-CM

## 2018-02-13 DIAGNOSIS — M8580 Other specified disorders of bone density and structure, unspecified site: Secondary | ICD-10-CM | POA: Diagnosis not present

## 2018-02-13 DIAGNOSIS — E7800 Pure hypercholesterolemia, unspecified: Secondary | ICD-10-CM | POA: Diagnosis not present

## 2018-02-13 DIAGNOSIS — E039 Hypothyroidism, unspecified: Secondary | ICD-10-CM | POA: Diagnosis not present

## 2018-02-13 DIAGNOSIS — Z13820 Encounter for screening for osteoporosis: Secondary | ICD-10-CM | POA: Diagnosis not present

## 2018-02-13 MED ORDER — ZOSTER VAC RECOMB ADJUVANTED 50 MCG/0.5ML IM SUSR: 0.5000 mL | Freq: Once | INTRAMUSCULAR | 1 refills | Status: AC

## 2018-02-13 NOTE — Progress Notes (Signed)
Subjective:   [name] is a 72 y.o. female who presents for Medicare Annual (Subsequent) preventive examination.  Review of Systems:  Doing well today  Questionnaire negative for concerns w/ mood, ADL capability, hearing, vision/   Exercises regularly Good diet No other specialists except saw dermatologist earlier thisyear removed basal cell ca         Objective:     Vitals: BP 125/80   Pulse 76   Temp 98.1 F (36.7 C) (Oral)   Wt 179 lb 6.4 oz (81.4 kg)   BMI 28.10 kg/m   Body mass index is 28.1 kg/m.  No flowsheet data found.  Tobacco Social History   Tobacco Use  Smoking Status Never Smoker  Smokeless Tobacco Never Used       Clinical Intake:  Past Medical History:  Diagnosis Date  . Depression   . Hyperlipidemia   . Menopause   . Thyroid disease    hypothyroid   Past Surgical History:  Procedure Laterality Date  . ABDOMINAL HYSTERECTOMY     Family History  Problem Relation Age of Onset  . Heart disease Mother   . Diabetes Mother   . Hyperlipidemia Mother   . Cancer Mother        kidney  . Heart attack Mother   . Heart disease Father    Social History   Socioeconomic History  . Marital status: Divorced    Spouse name: Not on file  . Number of children: Not on file  . Years of education: Not on file  . Highest education level: Not on file  Occupational History  . Not on file  Social Needs  . Financial resource strain: Not on file  . Food insecurity:    Worry: Not on file    Inability: Not on file  . Transportation needs:    Medical: Not on file    Non-medical: Not on file  Tobacco Use  . Smoking status: Never Smoker  . Smokeless tobacco: Never Used  Substance and Sexual Activity  . Alcohol use: No    Alcohol/week: 1.0 standard drinks    Types: 1 Standard drinks or equivalent per week    Frequency: Never  . Drug use: No  . Sexual activity: Not Currently  Lifestyle  . Physical activity:    Days per week: Not on file     Minutes per session: Not on file  . Stress: Not on file  Relationships  . Social connections:    Talks on phone: Not on file    Gets together: Not on file    Attends religious service: Not on file    Active member of club or organization: Not on file    Attends meetings of clubs or organizations: Not on file    Relationship status: Not on file  Other Topics Concern  . Not on file  Social History Narrative  . Not on file    Outpatient Encounter Medications as of 02/13/2018  Medication Sig  . aspirin 81 MG EC tablet Take 81 mg by mouth daily.    . Black Cohosh 40 MG CAPS Take by mouth daily.  . Calcium Carbonate-Vit D-Min (CALCIUM 1200 PO) Take 2 tablets by mouth daily.   . cholecalciferol (VITAMIN D) 1000 UNITS tablet Take 1,000 Units by mouth daily.    . citalopram (CELEXA) 20 MG tablet Take 2 tablets daily by mouth.  . citalopram (CELEXA) 40 MG tablet Take 1 tablet (40 mg total) daily by mouth.  Take 1 tab PO daily  . Cyanocobalamin (B-12) 2500 MCG TABS Take 2 tablets daily by mouth.  . estradiol (ESTRACE) 1 MG tablet TAKE 1 TABLET EVERY DAY  . fish oil-omega-3 fatty acids 1000 MG capsule Take 1 capsule by mouth daily.  . Flaxseed, Linseed, (FLAX SEED OIL) 1000 MG CAPS Take 1 capsule by mouth daily.  Marland Kitchen GARLIC PO Take 1 capsule by mouth.  . levothyroxine (SYNTHROID, LEVOTHROID) 25 MCG tablet TAKE 1 TABLET EVERY DAY  . Multiple Vitamin (MULTIVITAMIN) tablet Take 1 tablet by mouth daily.  . Multiple Vitamins-Minerals (HAIR/SKIN/NAILS PO) Take by mouth daily.  . ranitidine (ZANTAC) 150 MG tablet TAKE 1 TABLET TWICE DAILY   No facility-administered encounter medications on file as of 02/13/2018.     Activities of Daily Living No flowsheet data found.  Patient Care Team: Emeterio Reeve, DO as PCP - General (Osteopathic Medicine)    Assessment:   This is a routine wellness examination for [facility].  Exercise Activities and Dietary recommendations    Goals   None     Fall  Risk Fall Risk  09/29/2016 10/15/2013  Falls in the past year? No No  Comment Emmi Telephone Survey: data to providers prior to load -   Is the patient's home free of loose throw rugs in walkways, pet beds, electrical cords, etc?   yes      Grab bars in the bathroom? no      Handrails on the stairs?   yes      Adequate lighting?   yes  Timed Get Up and Go performed: no concerns   Depression Screen PHQ 2/9 Scores 01/15/2017 10/15/2013  PHQ - 2 Score 0 3  PHQ- 9 Score - 3     Cognitive Function        Immunization History  Administered Date(s) Administered  . Influenza,inj,Quad PF,6+ Mos 12/29/2014, 01/05/2016  . Influenza-Unspecified 12/11/2012  . Pneumococcal Conjugate-13 11/02/2014  . Pneumococcal Polysaccharide-23 10/10/2012  . Tdap 11/02/2014  . Zoster 03/14/2007    Qualifies for Shingles Vaccine? Yes   Screening Tests Health Maintenance  Topic Date Due  . INFLUENZA VACCINE  10/11/2017  . MAMMOGRAM  11/01/2018  . Fecal DNA (Cologuard)  03/09/2020  . TETANUS/TDAP  11/01/2024  . DEXA SCAN  Completed  . Hepatitis C Screening  Completed  . PNA vac Low Risk Adult  Completed    Cancer Screenings: Lung: Low Dose CT Chest recommended if Age 74-80 years, 30 pack-year currently smoking OR have quit w/in 15years. Patient does not qualify. Breast:  Up to date on Mammogram? Yes   Up to date of Bone Density/Dexa? No Colorectal: UTD  Additional Screenings: not needed: Hepatitis C Screening:      Plan:    The primary encounter diagnosis was Medicare annual wellness visit, subsequent. Diagnoses of Encounter for Medicare annual wellness exam, Screening for osteoporosis, Osteopenia, unspecified location, Elevated cholesterol, Prediabetes, and Hypothyroidism, unspecified type were also pertinent to this visit.  Orders Placed This Encounter  Procedures  . DG Bone Density  . CBC  . COMPLETE METABOLIC PANEL WITH GFR  . Lipid panel  . TSH  . Hemoglobin A1c      I have  personally reviewed and noted the following in the patient's chart:   . Medical and social history . Use of alcohol, tobacco or illicit drugs  . Current medications and supplements . Functional ability and status . Nutritional status . Physical activity . Advanced directives . List of other physicians .  Hospitalizations, surgeries, and ER visits in previous 12 months . Vitals . Screenings to include cognitive, depression, and falls . Referrals and appointments  In addition, I have reviewed and discussed with patient certain preventive protocols, quality metrics, and best practice recommendations. A written personalized care plan for preventive services as well as general preventive health recommendations were provided to patient.   Immunization History  Administered Date(s) Administered  . Influenza,inj,Quad PF,6+ Mos 12/29/2014, 01/05/2016  . Influenza-Unspecified 12/11/2012, 11/08/2017  . Pneumococcal Conjugate-13 11/02/2014  . Pneumococcal Polysaccharide-23 10/10/2012  . Tdap 11/02/2014  . Zoster 03/14/2007     Social History   Tobacco Use  Smoking Status Never Smoker  Smokeless Tobacco Never Used    Patient Instructions  General Preventive Care  Most recent routine screening lipids/other labs: ordered today. Cholesterol and Diabetes screening usually recommended annually.   Everyone should have blood pressure checked once per year.   Tobacco: don't! Alcohol: responsible moderation is ok for most adults - if you have concerns about your alcohol intake, please talk to me! Recreational/Illicit Drugs: don't!  Exercise: as tolerated to reduce risk of cardiovascular disease and diabetes. Strength training will also prevent osteoporosis.   Mental health: if need for mental health care (medicines, counseling, other), or concerns about moods, please let me know!   Sexual health: if need for STD testing, or if concerns with libido/pain problems, please let me  know! Vaccines  Flu vaccine: recommended for almost everyone, every fall (by Halloween! Flu is scary!)  Shingles vaccine: Shingrix recommended after age 45 - update to old Zostavax. See printed Rx  Pneumonia vaccines: you're all done!  Tetanus booster: Tdap recommended every 10 years - due 10/2024 Cancer screenings   Colon cancer screening: Cologuard due 02/2020  Breast cancer screening: mammogram due 10/2018   Cervical cancer screening: Can stop Pap at age 71 if previous testing normal   Lung cancer screening: not needed for non-smokers  Infection screenings . HIV: recommended screening as needed . Hepatitis C: recommended once for anyone born 80-1964 - already done . TB: certain at-risk populations Other . Bone Density Test: recommended for women at age 49, every other year - due now . Advanced Directive: Living Will and/or Healthcare Power of Attorney recommended for all adults, regardless of age or health! . Thyroid and Vitamin D: routine screening is not medically necessary, therefore most insurance will not cover this test as part of "free labs" on your annual physical.       Emeterio Reeve, DO  02/13/2018

## 2018-02-13 NOTE — Patient Instructions (Addendum)
General Preventive Care  Most recent routine screening lipids/other labs: ordered today. Cholesterol and Diabetes screening usually recommended annually.   Everyone should have blood pressure checked once per year.   Tobacco: don't! Alcohol: responsible moderation is ok for most adults - if you have concerns about your alcohol intake, please talk to me! Recreational/Illicit Drugs: don't!  Exercise: as tolerated to reduce risk of cardiovascular disease and diabetes. Strength training will also prevent osteoporosis.   Mental health: if need for mental health care (medicines, counseling, other), or concerns about moods, please let me know!   Sexual health: if need for STD testing, or if concerns with libido/pain problems, please let me know! Vaccines  Flu vaccine: recommended for almost everyone, every fall (by Halloween! Flu is scary!)  Shingles vaccine: Shingrix recommended after age 70 - update to old Zostavax. See printed Rx  Pneumonia vaccines: you're all done!  Tetanus booster: Tdap recommended every 10 years - due 10/2024 Cancer screenings   Colon cancer screening: Cologuard due 02/2020  Breast cancer screening: mammogram due 10/2018   Cervical cancer screening: Can stop Pap at age 96 if previous testing normal   Lung cancer screening: not needed for non-smokers  Infection screenings . HIV: recommended screening as needed . Hepatitis C: recommended once for anyone born 87-1964 - already done . TB: certain at-risk populations Other . Bone Density Test: recommended for women at age 33, every other year - due now . Advanced Directive: Living Will and/or Healthcare Power of Attorney recommended for all adults, regardless of age or health! . Thyroid and Vitamin D: routine screening is not medically necessary, therefore most insurance will not cover this test as part of "free labs" on your annual physical.

## 2018-02-19 ENCOUNTER — Telehealth: Payer: Self-pay

## 2018-02-19 MED ORDER — FLUTICASONE PROPIONATE 50 MCG/ACT NA SUSP: 1.0000 | Freq: Every day | NASAL | 3 refills | Status: DC

## 2018-02-19 NOTE — Telephone Encounter (Signed)
Pt advised.

## 2018-02-19 NOTE — Telephone Encounter (Signed)
Sent!

## 2018-02-19 NOTE — Telephone Encounter (Signed)
Pt called stating that a rx for an allergy nasal spray would be sent to [facility]. Pls advise. Thanks.

## 2018-02-20 ENCOUNTER — Ambulatory Visit (INDEPENDENT_AMBULATORY_CARE_PROVIDER_SITE_OTHER): Payer: Medicare Other

## 2018-02-20 DIAGNOSIS — Z13820 Encounter for screening for osteoporosis: Secondary | ICD-10-CM | POA: Diagnosis not present

## 2018-02-20 DIAGNOSIS — M8580 Other specified disorders of bone density and structure, unspecified site: Secondary | ICD-10-CM | POA: Diagnosis not present

## 2018-02-20 DIAGNOSIS — M8589 Other specified disorders of bone density and structure, multiple sites: Secondary | ICD-10-CM | POA: Diagnosis not present

## 2018-02-20 DIAGNOSIS — R7303 Prediabetes: Secondary | ICD-10-CM | POA: Diagnosis not present

## 2018-02-20 DIAGNOSIS — Z780 Asymptomatic menopausal state: Secondary | ICD-10-CM | POA: Diagnosis not present

## 2018-02-20 DIAGNOSIS — E7800 Pure hypercholesterolemia, unspecified: Secondary | ICD-10-CM | POA: Diagnosis not present

## 2018-02-20 DIAGNOSIS — E039 Hypothyroidism, unspecified: Secondary | ICD-10-CM | POA: Diagnosis not present

## 2018-02-20 DIAGNOSIS — Z0000 Encounter for general adult medical examination without abnormal findings: Secondary | ICD-10-CM | POA: Diagnosis not present

## 2018-02-21 LAB — COMPLETE METABOLIC PANEL WITH GFR
AG Ratio: 1.5 (calc) (ref 1.0–2.5)
ALKALINE PHOSPHATASE (APISO): 82 U/L (ref 33–130)
ALT: 14 U/L (ref 6–29)
AST: 18 U/L (ref 10–35)
Albumin: 4.1 g/dL (ref 3.6–5.1)
BILIRUBIN TOTAL: 0.8 mg/dL (ref 0.2–1.2)
BUN: 12 mg/dL (ref 7–25)
CO2: 29 mmol/L (ref 20–32)
CREATININE: 0.7 mg/dL (ref 0.60–0.93)
Calcium: 9.2 mg/dL (ref 8.6–10.4)
Chloride: 104 mmol/L (ref 98–110)
GFR, EST AFRICAN AMERICAN: 101 mL/min/{1.73_m2} (ref >=60)
GFR, Est Non African American: 87 mL/min/{1.73_m2} (ref >=60)
GLUCOSE: 102 mg/dL — AB (ref 65–99)
Globulin: 2.7 g/dL (calc) (ref 1.9–3.7)
POTASSIUM: 4.9 mmol/L (ref 3.5–5.3)
SODIUM: 139 mmol/L (ref 135–146)
TOTAL PROTEIN: 6.8 g/dL (ref 6.1–8.1)

## 2018-02-21 LAB — HEMOGLOBIN A1C
Hgb A1c MFr Bld: 6.2 % of total Hgb — ABNORMAL HIGH (ref <5.7)
Mean Plasma Glucose: 131 (calc)
eAG (mmol/L): 7.3 (calc)

## 2018-02-21 LAB — LIPID PANEL
Cholesterol: 228 mg/dL — ABNORMAL HIGH (ref <200)
HDL: 66 mg/dL (ref >50)
LDL Cholesterol (Calc): 136 mg/dL (calc) — ABNORMAL HIGH
Non-HDL Cholesterol (Calc): 162 mg/dL (calc) — ABNORMAL HIGH (ref <130)
Total CHOL/HDL Ratio: 3.5 (calc) (ref <5.0)
Triglycerides: 136 mg/dL (ref <150)

## 2018-02-21 LAB — CBC
HEMATOCRIT: 40 % (ref 35.0–45.0)
HEMOGLOBIN: 13.2 g/dL (ref 11.7–15.5)
MCH: 30.6 pg (ref 27.0–33.0)
MCHC: 33 g/dL (ref 32.0–36.0)
MCV: 92.8 fL (ref 80.0–100.0)
MPV: 10.7 fL (ref 7.5–12.5)
PLATELETS: 299 10*3/uL (ref 140–400)
RBC: 4.31 10*6/uL (ref 3.80–5.10)
RDW: 12.5 % (ref 11.0–15.0)
WBC: 6.5 10*3/uL (ref 3.8–10.8)

## 2018-02-21 LAB — TSH: TSH: 3.73 mIU/L (ref 0.40–4.50)

## 2018-02-27 ENCOUNTER — Other Ambulatory Visit: Payer: Medicare Other

## 2018-03-26 ENCOUNTER — Other Ambulatory Visit: Payer: Self-pay | Admitting: Obstetrics & Gynecology

## 2018-03-26 DIAGNOSIS — Z780 Asymptomatic menopausal state: Secondary | ICD-10-CM

## 2018-03-28 ENCOUNTER — Telehealth: Payer: Self-pay

## 2018-03-28 NOTE — Telephone Encounter (Signed)
Thayer Headings called and states she is taking Ranitidine and wanted to know if there was a different medication she can take. Please advise.

## 2018-03-29 MED ORDER — FAMOTIDINE 20 MG PO TABS: 20.0000 mg | ORAL_TABLET | Freq: Two times a day (BID) | ORAL | 3 refills | Status: DC

## 2018-03-29 NOTE — Telephone Encounter (Signed)
Patient advised.

## 2018-03-29 NOTE — Telephone Encounter (Signed)
Sent Pepcid as alternative

## 2018-06-05 ENCOUNTER — Other Ambulatory Visit: Payer: Self-pay | Admitting: Obstetrics & Gynecology

## 2018-06-05 DIAGNOSIS — E039 Hypothyroidism, unspecified: Secondary | ICD-10-CM

## 2018-06-25 ENCOUNTER — Other Ambulatory Visit: Payer: Self-pay

## 2018-06-25 MED ORDER — FAMOTIDINE 20 MG PO TABS: 20.0000 mg | ORAL_TABLET | Freq: Two times a day (BID) | ORAL | 2 refills | Status: DC

## 2018-07-29 DIAGNOSIS — D1801 Hemangioma of skin and subcutaneous tissue: Secondary | ICD-10-CM | POA: Diagnosis not present

## 2018-07-29 DIAGNOSIS — L579 Skin changes due to chronic exposure to nonionizing radiation, unspecified: Secondary | ICD-10-CM | POA: Diagnosis not present

## 2018-07-29 DIAGNOSIS — L814 Other melanin hyperpigmentation: Secondary | ICD-10-CM | POA: Diagnosis not present

## 2018-07-29 DIAGNOSIS — Z85828 Personal history of other malignant neoplasm of skin: Secondary | ICD-10-CM | POA: Diagnosis not present

## 2018-07-29 DIAGNOSIS — L821 Other seborrheic keratosis: Secondary | ICD-10-CM | POA: Diagnosis not present

## 2018-08-01 ENCOUNTER — Other Ambulatory Visit: Payer: Self-pay | Admitting: Obstetrics & Gynecology

## 2018-08-01 DIAGNOSIS — Z780 Asymptomatic menopausal state: Secondary | ICD-10-CM

## 2018-08-01 DIAGNOSIS — M8580 Other specified disorders of bone density and structure, unspecified site: Secondary | ICD-10-CM

## 2018-09-20 ENCOUNTER — Other Ambulatory Visit: Payer: Self-pay | Admitting: Osteopathic Medicine

## 2018-09-20 DIAGNOSIS — Z9289 Personal history of other medical treatment: Secondary | ICD-10-CM

## 2018-11-05 ENCOUNTER — Other Ambulatory Visit: Payer: Self-pay

## 2018-11-05 ENCOUNTER — Ambulatory Visit
Admission: RE | Admit: 2018-11-05 | Discharge: 2018-11-05 | Disposition: A | Discharge status: Home / Self Care | Payer: Medicare Other | Source: Ambulatory Visit | Attending: Osteopathic Medicine | Admitting: Osteopathic Medicine

## 2018-11-05 DIAGNOSIS — Z1231 Encounter for screening mammogram for malignant neoplasm of breast: Secondary | ICD-10-CM | POA: Diagnosis not present

## 2018-11-05 DIAGNOSIS — Z9289 Personal history of other medical treatment: Secondary | ICD-10-CM

## 2018-11-09 DIAGNOSIS — Z23 Encounter for immunization: Secondary | ICD-10-CM | POA: Diagnosis not present

## 2018-11-26 ENCOUNTER — Other Ambulatory Visit: Payer: Self-pay | Admitting: Osteopathic Medicine

## 2019-01-27 DIAGNOSIS — D225 Melanocytic nevi of trunk: Secondary | ICD-10-CM | POA: Diagnosis not present

## 2019-01-27 DIAGNOSIS — Z85828 Personal history of other malignant neoplasm of skin: Secondary | ICD-10-CM | POA: Diagnosis not present

## 2019-01-27 DIAGNOSIS — L814 Other melanin hyperpigmentation: Secondary | ICD-10-CM | POA: Diagnosis not present

## 2019-01-27 DIAGNOSIS — L579 Skin changes due to chronic exposure to nonionizing radiation, unspecified: Secondary | ICD-10-CM | POA: Diagnosis not present

## 2019-01-27 DIAGNOSIS — D1801 Hemangioma of skin and subcutaneous tissue: Secondary | ICD-10-CM | POA: Diagnosis not present

## 2019-01-27 DIAGNOSIS — L720 Epidermal cyst: Secondary | ICD-10-CM | POA: Diagnosis not present

## 2019-01-27 DIAGNOSIS — L821 Other seborrheic keratosis: Secondary | ICD-10-CM | POA: Diagnosis not present

## 2019-02-04 ENCOUNTER — Other Ambulatory Visit: Payer: Self-pay

## 2019-02-17 ENCOUNTER — Ambulatory Visit (INDEPENDENT_AMBULATORY_CARE_PROVIDER_SITE_OTHER): Payer: Medicare Other | Admitting: Osteopathic Medicine

## 2019-02-17 ENCOUNTER — Other Ambulatory Visit: Payer: Self-pay

## 2019-02-17 ENCOUNTER — Encounter: Payer: Self-pay | Admitting: Osteopathic Medicine

## 2019-02-17 VITALS — BP 126/69 | HR 79 | Temp 97.7°F | Wt 180.1 lb

## 2019-02-17 DIAGNOSIS — E7800 Pure hypercholesterolemia, unspecified: Secondary | ICD-10-CM | POA: Diagnosis not present

## 2019-02-17 DIAGNOSIS — M8580 Other specified disorders of bone density and structure, unspecified site: Secondary | ICD-10-CM | POA: Diagnosis not present

## 2019-02-17 DIAGNOSIS — E039 Hypothyroidism, unspecified: Secondary | ICD-10-CM

## 2019-02-17 DIAGNOSIS — Z0000 Encounter for general adult medical examination without abnormal findings: Secondary | ICD-10-CM | POA: Diagnosis not present

## 2019-02-17 DIAGNOSIS — R7303 Prediabetes: Secondary | ICD-10-CM

## 2019-02-17 DIAGNOSIS — R319 Hematuria, unspecified: Secondary | ICD-10-CM

## 2019-02-17 NOTE — Patient Instructions (Addendum)
General Preventive Care  Most recent routine screening lipids/other labs: ordered today   Everyone should have blood pressure checked once per year.   Tobacco: don't!   Alcohol: responsible moderation is ok for most adults - if you have concerns about your alcohol intake, please talk to me!   Exercise: as tolerated to reduce risk of cardiovascular disease and diabetes. Strength training will also prevent osteoporosis.   Mental health: if need for mental health care (medicines, counseling, other), or concerns about moods, please let me know!   Sexual health: if need for STD testing, or if concerns with libido/pain problems, please let me know!   Advanced Directive: Living Will and/or Healthcare Power of Attorney recommended for all adults, regardless of age or health.  Vaccines  Flu vaccine: recommended for almost everyone, every fall. Thanks for getting your this year!   Shingles vaccine:all done!   Pneumonia vaccines: all done!   Tetanus booster: Tdap recommended every 10 years. Due 2026  Cancer screenings   Colon cancer screening: recommended for everyone at age 86-75. Due 02/2020 (can do Cologuard again or we can refer for colonoscopy)   Breast cancer screening: mammogram recommended annually after age 74. Due 10/2019.   Cervical cancer screening: Can usually stop Pap at age 31 or w/ hysterectomy.   Lung cancer screening: not needed for non-smokers  Infection screenings . HIV: recommended screening at least once age 46-65, more often as needed. . Gonorrhea/Chlamydia: screening as needed. . Hepatitis C: recommended for anyone born 39-1964. Done 2016.  . TB: certain at-risk populations, or depending on work requirements and/or travel history Other  Bone Density Test: recommended every other year for women over age 27. Due next year!

## 2019-02-17 NOTE — Progress Notes (Signed)
Subjective:   [name] is a 73 y.o. female who presents for Medicare Annual (Subsequent) preventive examination.  Review of Systems:  Doing well today  Questionnaire negative for concerns w/ mood, ADL capability, hearing, vision/   Exercises regularly Good diet No other specialists except saw dermatologist earlier thisyear removed basal cell ca         Objective:     Vitals: There were no vitals taken for this visit.  There is no height or weight on file to calculate BMI.  No flowsheet data found.  Tobacco Social History   Tobacco Use  Smoking Status Never Smoker  Smokeless Tobacco Never Used       Clinical Intake:  Past Medical History:  Diagnosis Date  . BCC (basal cell carcinoma of skin) 03/27/2017   Left Lower Leg Behind Knee (tx p bx)  . Depression   . Hyperlipidemia   . Menopause   . Nodular basal cell carcinoma (BCC) 01/18/2017   Lower Left Leg (Dr. Sheppard Coil)  . Thyroid disease    hypothyroid   Past Surgical History:  Procedure Laterality Date  . ABDOMINAL HYSTERECTOMY     Family History  Problem Relation Age of Onset  . Heart disease Mother   . Diabetes Mother   . Hyperlipidemia Mother   . Cancer Mother        kidney  . Heart attack Mother   . Heart disease Father    Social History   Socioeconomic History  . Marital status: Divorced    Spouse name: Not on file  . Number of children: Not on file  . Years of education: Not on file  . Highest education level: Not on file  Occupational History  . Not on file  Social Needs  . Financial resource strain: Not on file  . Food insecurity    Worry: Not on file    Inability: Not on file  . Transportation needs    Medical: Not on file    Non-medical: Not on file  Tobacco Use  . Smoking status: Never Smoker  . Smokeless tobacco: Never Used  Substance and Sexual Activity  . Alcohol use: No    Alcohol/week: 1.0 standard drinks    Types: 1 Standard drinks or equivalent per week   Frequency: Never  . Drug use: No  . Sexual activity: Not Currently  Lifestyle  . Physical activity    Days per week: Not on file    Minutes per session: Not on file  . Stress: Not on file  Relationships  . Social Herbalist on phone: Not on file    Gets together: Not on file    Attends religious service: Not on file    Active member of club or organization: Not on file    Attends meetings of clubs or organizations: Not on file    Relationship status: Not on file  Other Topics Concern  . Not on file  Social History Narrative  . Not on file    Outpatient Encounter Medications as of 02/17/2019  Medication Sig  . aspirin 81 MG EC tablet Take 81 mg by mouth daily.    . Black Cohosh 40 MG CAPS Take by mouth daily.  . Calcium Carbonate-Vit D-Min (CALCIUM 1200 PO) Take 2 tablets by mouth daily.   . cholecalciferol (VITAMIN D) 1000 UNITS tablet Take 1,000 Units by mouth daily.    . citalopram (CELEXA) 20 MG tablet Take 2 tablets daily by mouth.  Marland Kitchen  citalopram (CELEXA) 40 MG tablet TAKE 1 TABLET EVERY DAY  . Cyanocobalamin (B-12) 2500 MCG TABS Take 2 tablets daily by mouth.  . estradiol (ESTRACE) 1 MG tablet TAKE 1 TABLET EVERY DAY  . famotidine (PEPCID) 20 MG tablet TAKE 1 TABLET TWICE DAILY  . fish oil-omega-3 fatty acids 1000 MG capsule Take 1 capsule by mouth daily.  . Flaxseed, Linseed, (FLAX SEED OIL) 1000 MG CAPS Take 1 capsule by mouth daily.  . fluticasone (FLONASE) 50 MCG/ACT nasal spray Place 1 spray into both nostrils daily.  Marland Kitchen GARLIC PO Take 1 capsule by mouth.  . levothyroxine (SYNTHROID, LEVOTHROID) 25 MCG tablet TAKE 1 TABLET EVERY DAY  . Multiple Vitamin (MULTIVITAMIN) tablet Take 1 tablet by mouth daily.  . Multiple Vitamins-Minerals (HAIR/SKIN/NAILS PO) Take by mouth daily.  . ranitidine (ZANTAC) 150 MG tablet TAKE 1 TABLET TWICE DAILY   No facility-administered encounter medications on file as of 02/17/2019.     Activities of Daily Living No flowsheet  data found.  Patient Care Team: Emeterio Reeve, DO as PCP - General (Osteopathic Medicine)    Assessment:   This is a routine wellness examination for [facility].  Exercise Activities and Dietary recommendations    Goals   None     Fall Risk Fall Risk  09/29/2016 10/15/2013  Falls in the past year? No No  Comment Emmi Telephone Survey: data to providers prior to load -   Is the patient's home free of loose throw rugs in walkways, pet beds, electrical cords, etc?   yes      Grab bars in the bathroom? no      Handrails on the stairs?   yes      Adequate lighting?   yes  Timed Get Up and Go performed: no concerns   Depression Screen PHQ 2/9 Scores 01/15/2017 10/15/2013  PHQ - 2 Score 0 3  PHQ- 9 Score - 3     Cognitive Function        Immunization History  Administered Date(s) Administered  . Influenza, High Dose Seasonal PF 11/08/2018  . Influenza,inj,Quad PF,6+ Mos 12/29/2014, 01/05/2016  . Influenza-Unspecified 12/11/2012, 11/08/2017, 11/09/2018  . Pneumococcal Conjugate-13 11/02/2014  . Pneumococcal Polysaccharide-23 10/10/2012  . Tdap 11/02/2014  . Zoster 03/14/2007  . Zoster Recombinat (Shingrix) 02/20/2018, 04/21/2018    Qualifies for Shingles Vaccine? Yes   Screening Tests Health Maintenance  Topic Date Due  . MAMMOGRAM  11/05/2019  . Fecal DNA (Cologuard)  03/09/2020  . TETANUS/TDAP  11/01/2024  . INFLUENZA VACCINE  Completed  . DEXA SCAN  Completed  . Hepatitis C Screening  Completed  . PNA vac Low Risk Adult  Completed    Cancer Screenings: Lung: Low Dose CT Chest recommended if Age 21-80 years, 30 pack-year currently smoking OR have quit w/in 15years. Patient does not qualify. Breast:  Up to date on Mammogram? Yes   Up to date of Bone Density/Dexa? No Colorectal: UTD  Additional Screenings: not needed: Hepatitis C Screening:      Plan:    The primary encounter diagnosis was Medicare annual wellness visit, subsequent. Diagnoses of  Prediabetes, Osteopenia, unspecified location, Hypothyroidism, unspecified type, Elevated cholesterol, and Hematuria, unspecified type were also pertinent to this visit.  Orders Placed This Encounter  Procedures  . CBC  . COMPLETE METABOLIC PANEL WITH GFR  . LIPID SCREENING  . TSH  . VITAMIN D 25  . A1C  . Urinalysis, Routine w reflex microscopic      I have personally  reviewed and noted the following in the patient's chart:   . Medical and social history . Use of alcohol, tobacco or illicit drugs  . Current medications and supplements . Functional ability and status . Nutritional status . Physical activity . Advanced directives . List of other physicians . Hospitalizations, surgeries, and ER visits in previous 12 months . Vitals . Screenings to include cognitive, depression, and falls . Referrals and appointments  In addition, I have reviewed and discussed with patient certain preventive protocols, quality metrics, and best practice recommendations. A written personalized care plan for preventive services as well as general preventive health recommendations were provided to patient.   Immunization History  Administered Date(s) Administered  . Influenza, High Dose Seasonal PF 11/08/2018  . Influenza,inj,Quad PF,6+ Mos 12/29/2014, 01/05/2016  . Influenza-Unspecified 12/11/2012, 11/08/2017, 11/09/2018  . Pneumococcal Conjugate-13 11/02/2014  . Pneumococcal Polysaccharide-23 10/10/2012  . Tdap 11/02/2014  . Zoster 03/14/2007  . Zoster Recombinat (Shingrix) 02/20/2018, 04/21/2018     Social History   Tobacco Use  Smoking Status Never Smoker  Smokeless Tobacco Never Used    Patient Instructions  General Preventive Care  Most recent routine screening lipids/other labs: ordered today   Everyone should have blood pressure checked once per year.   Tobacco: don't!   Alcohol: responsible moderation is ok for most adults - if you have concerns about your alcohol  intake, please talk to me!   Exercise: as tolerated to reduce risk of cardiovascular disease and diabetes. Strength training will also prevent osteoporosis.   Mental health: if need for mental health care (medicines, counseling, other), or concerns about moods, please let me know!   Sexual health: if need for STD testing, or if concerns with libido/pain problems, please let me know!   Advanced Directive: Living Will and/or Healthcare Power of Attorney recommended for all adults, regardless of age or health.  Vaccines  Flu vaccine: recommended for almost everyone, every fall. Thanks for getting your this year!   Shingles vaccine:all done!   Pneumonia vaccines: all done!   Tetanus booster: Tdap recommended every 10 years. Due 2026  Cancer screenings   Colon cancer screening: recommended for everyone at age 76-75. Due 02/2020 (can do Cologuard again or we can refer for colonoscopy)   Breast cancer screening: mammogram recommended annually after age 72. Due 10/2019.   Cervical cancer screening: Can usually stop Pap at age 35 or w/ hysterectomy.   Lung cancer screening: not needed for non-smokers  Infection screenings . HIV: recommended screening at least once age 94-65, more often as needed. . Gonorrhea/Chlamydia: screening as needed. . Hepatitis C: recommended for anyone born 07-1962. Done 2016.  . TB: certain at-risk populations, or depending on work requirements and/or travel history Other  Bone Density Test: recommended every other year for women over age 72. Due next year!      Emeterio Reeve, DO  02/17/2019

## 2019-02-19 LAB — URINALYSIS, ROUTINE W REFLEX MICROSCOPIC
Bacteria, UA: NONE SEEN /HPF
Bilirubin Urine: NEGATIVE
Glucose, UA: NEGATIVE
Hyaline Cast: NONE SEEN /LPF
Ketones, ur: NEGATIVE
Leukocytes,Ua: NEGATIVE
Nitrite: NEGATIVE
Protein, ur: NEGATIVE
Specific Gravity, Urine: 1.013 (ref 1.001–1.03)
WBC, UA: NONE SEEN /HPF (ref 0–5)
pH: 6 (ref 5.0–8.0)

## 2019-02-19 LAB — COMPLETE METABOLIC PANEL WITH GFR
AG Ratio: 2.2 (calc) (ref 1.0–2.5)
ALT: 22 U/L (ref 6–29)
AST: 22 U/L (ref 10–35)
Albumin: 4.4 g/dL (ref 3.6–5.1)
Alkaline phosphatase (APISO): 75 U/L (ref 37–153)
BUN: 14 mg/dL (ref 7–25)
CO2: 26 mmol/L (ref 20–32)
Calcium: 8.8 mg/dL (ref 8.6–10.4)
Chloride: 103 mmol/L (ref 98–110)
Creat: 0.64 mg/dL (ref 0.60–0.93)
GFR, Est African American: 103 mL/min/{1.73_m2} (ref >=60)
GFR, Est Non African American: 89 mL/min/{1.73_m2} (ref >=60)
Globulin: 2 g/dL (calc) (ref 1.9–3.7)
Glucose, Bld: 88 mg/dL (ref 65–99)
Potassium: 4.3 mmol/L (ref 3.5–5.3)
Sodium: 139 mmol/L (ref 135–146)
Total Bilirubin: 0.9 mg/dL (ref 0.2–1.2)
Total Protein: 6.4 g/dL (ref 6.1–8.1)

## 2019-02-19 LAB — CBC
HCT: 38.8 % (ref 35.0–45.0)
Hemoglobin: 13.1 g/dL (ref 11.7–15.5)
MCH: 31.6 pg (ref 27.0–33.0)
MCHC: 33.8 g/dL (ref 32.0–36.0)
MCV: 93.5 fL (ref 80.0–100.0)
MPV: 11.2 fL (ref 7.5–12.5)
Platelets: 253 10*3/uL (ref 140–400)
RBC: 4.15 10*6/uL (ref 3.80–5.10)
RDW: 12.4 % (ref 11.0–15.0)
WBC: 4.9 10*3/uL (ref 3.8–10.8)

## 2019-02-19 LAB — TSH: TSH: 3.2 mIU/L (ref 0.40–4.50)

## 2019-02-19 LAB — VITAMIN D 25 HYDROXY (VIT D DEFICIENCY, FRACTURES): Vit D, 25-Hydroxy: 38 ng/mL (ref 30–100)

## 2019-02-19 LAB — LIPID PANEL
Cholesterol: 194 mg/dL (ref <200)
HDL: 64 mg/dL (ref >=50)
LDL Cholesterol (Calc): 112 mg/dL (calc) — ABNORMAL HIGH
Non-HDL Cholesterol (Calc): 130 mg/dL (calc) — ABNORMAL HIGH (ref <130)
Total CHOL/HDL Ratio: 3 (calc) (ref <5.0)
Triglycerides: 79 mg/dL (ref <150)

## 2019-02-19 LAB — HEMOGLOBIN A1C
Hgb A1c MFr Bld: 6.1 % of total Hgb — ABNORMAL HIGH (ref <5.7)
Mean Plasma Glucose: 128 (calc)
eAG (mmol/L): 7.1 (calc)

## 2019-03-04 ENCOUNTER — Other Ambulatory Visit: Payer: Self-pay | Admitting: Osteopathic Medicine

## 2019-03-04 NOTE — Telephone Encounter (Signed)
Requested medication (s) are due for refill today: yes  Requested medication (s) are on the active medication list: yes  Last refill: 12/11/2018  Future visit scheduled: no  Notes to clinic:  no valid encounter within last 12 months   Requested Prescriptions  Pending Prescriptions Disp Refills   famotidine (PEPCID) 20 MG tablet [Pharmacy Med Name: FAMOTIDINE 20 MG Tablet] 180 tablet 0    Sig: TAKE 1 TABLET TWICE DAILY      Gastroenterology:  H2 Antagonists Failed - 03/04/2019  1:46 PM      Failed - Valid encounter within last 12 months    Recent Outpatient Visits           2 weeks ago Medicare annual wellness visit, subsequent   [place], Lanelle Bal, DO   1 year ago Medicare annual wellness visit, subsequent   [facility], Lanelle Bal, DO   2 years ago Skin nodule   [facility], Lanelle Bal, DO   2 years ago Colon cancer screening   [facility], Lanelle Bal, DO   4 years ago Medicare annual wellness visit, initial   [place], Royetta Car, PA-C

## 2019-03-26 ENCOUNTER — Other Ambulatory Visit: Payer: Self-pay | Admitting: Osteopathic Medicine

## 2019-03-26 NOTE — Telephone Encounter (Signed)
Please review for refill- patient at [facility] 

## 2019-04-07 ENCOUNTER — Telehealth: Payer: Self-pay | Admitting: Osteopathic Medicine

## 2019-04-07 NOTE — Telephone Encounter (Signed)
Patient called and thought she needed labs sooner but per last note she will not need them until 02/2020. She will call a week or two prior to get them completed. No other questions.

## 2019-04-28 ENCOUNTER — Other Ambulatory Visit: Payer: Self-pay | Admitting: Obstetrics & Gynecology

## 2019-04-28 DIAGNOSIS — E039 Hypothyroidism, unspecified: Secondary | ICD-10-CM

## 2019-05-04 ENCOUNTER — Ambulatory Visit: Payer: Medicare Other | Attending: Internal Medicine

## 2019-05-04 DIAGNOSIS — Z23 Encounter for immunization: Secondary | ICD-10-CM | POA: Insufficient documentation

## 2019-05-04 NOTE — Progress Notes (Signed)
   Covid-19 Vaccination Clinic  Name:  [name]    MRN: [id number] DOB: 1945/03/23  05/04/2019  [name] was observed post Covid-19 immunization for 15 minutes without incidence. She was provided with Vaccine Information Sheet and instruction to access the V-Safe system.   [name] was instructed to call 911 with any severe reactions post vaccine: Marland Kitchen Difficulty breathing  . Swelling of your face and throat  . A fast heartbeat  . A bad rash all over your body  . Dizziness and weakness    Immunizations Administered    Name Date Dose VIS Date Route   Pfizer COVID-19 Vaccine 05/04/2019  2:17 PM 0.3 mL 02/21/2019 Intramuscular   Manufacturer: [place]   Lot: [id number]   [place]: [id number]

## 2019-05-07 ENCOUNTER — Other Ambulatory Visit: Payer: Self-pay | Admitting: Obstetrics & Gynecology

## 2019-05-07 DIAGNOSIS — Z780 Asymptomatic menopausal state: Secondary | ICD-10-CM

## 2019-05-19 ENCOUNTER — Other Ambulatory Visit: Payer: Self-pay

## 2019-05-19 DIAGNOSIS — Z780 Asymptomatic menopausal state: Secondary | ICD-10-CM

## 2019-05-19 MED ORDER — CITALOPRAM HYDROBROMIDE 40 MG PO TABS: 40.0000 mg | ORAL_TABLET | Freq: Every day | ORAL | 1 refills | Status: DC

## 2019-05-28 ENCOUNTER — Ambulatory Visit: Payer: Medicare Other | Attending: Internal Medicine

## 2019-05-28 DIAGNOSIS — Z23 Encounter for immunization: Secondary | ICD-10-CM

## 2019-05-28 NOTE — Progress Notes (Signed)
   Covid-19 Vaccination Clinic  Name:  [name]    MRN: [id number] DOB: [birth year]  05/28/2019  [name] was observed post Covid-19 immunization for 15 minutes without incident. She was provided with Vaccine Information Sheet and instruction to access the V-Safe system.   [name] was instructed to call 911 with any severe reactions post vaccine: Marland Kitchen Difficulty breathing  . Swelling of face and throat  . A fast heartbeat  . A bad rash all over body  . Dizziness and weakness   Immunizations Administered    Name Date Dose VIS Date Route   Pfizer COVID-19 Vaccine 05/28/2019  2:15 PM 0.3 mL 02/21/2019 Intramuscular   Manufacturer: [place]   Lot: [id number]   [place]: [id number]

## 2019-06-19 ENCOUNTER — Other Ambulatory Visit: Payer: Self-pay | Admitting: *Deleted

## 2019-06-19 DIAGNOSIS — M8580 Other specified disorders of bone density and structure, unspecified site: Secondary | ICD-10-CM

## 2019-06-19 DIAGNOSIS — E039 Hypothyroidism, unspecified: Secondary | ICD-10-CM

## 2019-06-19 DIAGNOSIS — Z780 Asymptomatic menopausal state: Secondary | ICD-10-CM

## 2019-06-19 MED ORDER — ESTRADIOL 1 MG PO TABS: 1.0000 mg | ORAL_TABLET | Freq: Every day | ORAL | 3 refills | Status: DC

## 2019-06-19 MED ORDER — LEVOTHYROXINE SODIUM 25 MCG PO TABS: 25.0000 ug | ORAL_TABLET | Freq: Every day | ORAL | 3 refills | Status: DC

## 2019-06-19 NOTE — Telephone Encounter (Signed)
Fax received from pharmacy.  Will forward to [place]

## 2019-06-25 ENCOUNTER — Other Ambulatory Visit: Payer: Self-pay

## 2019-06-25 DIAGNOSIS — Z780 Asymptomatic menopausal state: Secondary | ICD-10-CM

## 2019-06-25 DIAGNOSIS — M8580 Other specified disorders of bone density and structure, unspecified site: Secondary | ICD-10-CM

## 2019-06-25 DIAGNOSIS — E039 Hypothyroidism, unspecified: Secondary | ICD-10-CM

## 2019-06-25 MED ORDER — LEVOTHYROXINE SODIUM 25 MCG PO TABS: 25.0000 ug | ORAL_TABLET | Freq: Every day | ORAL | 3 refills | Status: DC

## 2019-06-25 MED ORDER — ESTRADIOL 1 MG PO TABS: 1.0000 mg | ORAL_TABLET | Freq: Every day | ORAL | 3 refills | Status: DC

## 2019-07-28 DIAGNOSIS — L579 Skin changes due to chronic exposure to nonionizing radiation, unspecified: Secondary | ICD-10-CM | POA: Diagnosis not present

## 2019-07-28 DIAGNOSIS — L814 Other melanin hyperpigmentation: Secondary | ICD-10-CM | POA: Diagnosis not present

## 2019-07-28 DIAGNOSIS — D225 Melanocytic nevi of trunk: Secondary | ICD-10-CM | POA: Diagnosis not present

## 2019-07-28 DIAGNOSIS — D1801 Hemangioma of skin and subcutaneous tissue: Secondary | ICD-10-CM | POA: Diagnosis not present

## 2019-07-28 DIAGNOSIS — Z85828 Personal history of other malignant neoplasm of skin: Secondary | ICD-10-CM | POA: Diagnosis not present

## 2019-07-28 DIAGNOSIS — L821 Other seborrheic keratosis: Secondary | ICD-10-CM | POA: Diagnosis not present

## 2019-08-28 ENCOUNTER — Other Ambulatory Visit: Payer: Self-pay | Admitting: Osteopathic Medicine

## 2019-09-25 ENCOUNTER — Other Ambulatory Visit: Payer: Self-pay | Admitting: Osteopathic Medicine

## 2019-09-25 DIAGNOSIS — Z1231 Encounter for screening mammogram for malignant neoplasm of breast: Secondary | ICD-10-CM

## 2019-11-07 ENCOUNTER — Ambulatory Visit
Admission: RE | Admit: 2019-11-07 | Discharge: 2019-11-07 | Disposition: A | Discharge status: Home / Self Care | Payer: Medicare Other | Source: Ambulatory Visit | Attending: Osteopathic Medicine | Admitting: Osteopathic Medicine

## 2019-11-07 ENCOUNTER — Other Ambulatory Visit: Payer: Self-pay

## 2019-11-07 DIAGNOSIS — Z1231 Encounter for screening mammogram for malignant neoplasm of breast: Secondary | ICD-10-CM

## 2019-11-30 DIAGNOSIS — Z23 Encounter for immunization: Secondary | ICD-10-CM | POA: Diagnosis not present

## 2020-01-10 ENCOUNTER — Other Ambulatory Visit: Payer: Self-pay | Admitting: Osteopathic Medicine

## 2020-01-22 ENCOUNTER — Other Ambulatory Visit: Payer: Self-pay | Admitting: Osteopathic Medicine

## 2020-01-29 DIAGNOSIS — L820 Inflamed seborrheic keratosis: Secondary | ICD-10-CM | POA: Diagnosis not present

## 2020-01-29 DIAGNOSIS — L814 Other melanin hyperpigmentation: Secondary | ICD-10-CM | POA: Diagnosis not present

## 2020-01-29 DIAGNOSIS — L579 Skin changes due to chronic exposure to nonionizing radiation, unspecified: Secondary | ICD-10-CM | POA: Diagnosis not present

## 2020-01-29 DIAGNOSIS — Z85828 Personal history of other malignant neoplasm of skin: Secondary | ICD-10-CM | POA: Diagnosis not present

## 2020-01-29 DIAGNOSIS — D225 Melanocytic nevi of trunk: Secondary | ICD-10-CM | POA: Diagnosis not present

## 2020-01-29 DIAGNOSIS — D1801 Hemangioma of skin and subcutaneous tissue: Secondary | ICD-10-CM | POA: Diagnosis not present

## 2020-02-18 ENCOUNTER — Other Ambulatory Visit: Payer: Self-pay

## 2020-02-18 ENCOUNTER — Ambulatory Visit (INDEPENDENT_AMBULATORY_CARE_PROVIDER_SITE_OTHER): Payer: Medicare Other | Admitting: Osteopathic Medicine

## 2020-02-18 ENCOUNTER — Encounter: Payer: Medicare Other | Admitting: Osteopathic Medicine

## 2020-02-18 ENCOUNTER — Encounter: Payer: Self-pay | Admitting: Osteopathic Medicine

## 2020-02-18 VITALS — BP 135/82 | HR 83 | Temp 97.5°F | Wt 178.0 lb

## 2020-02-18 DIAGNOSIS — Z1231 Encounter for screening mammogram for malignant neoplasm of breast: Secondary | ICD-10-CM

## 2020-02-18 DIAGNOSIS — M8580 Other specified disorders of bone density and structure, unspecified site: Secondary | ICD-10-CM | POA: Diagnosis not present

## 2020-02-18 DIAGNOSIS — Z1211 Encounter for screening for malignant neoplasm of colon: Secondary | ICD-10-CM | POA: Diagnosis not present

## 2020-02-18 DIAGNOSIS — E7800 Pure hypercholesterolemia, unspecified: Secondary | ICD-10-CM

## 2020-02-18 DIAGNOSIS — Z780 Asymptomatic menopausal state: Secondary | ICD-10-CM

## 2020-02-18 DIAGNOSIS — E039 Hypothyroidism, unspecified: Secondary | ICD-10-CM

## 2020-02-18 DIAGNOSIS — R7303 Prediabetes: Secondary | ICD-10-CM | POA: Diagnosis not present

## 2020-02-18 DIAGNOSIS — Z0000 Encounter for general adult medical examination without abnormal findings: Secondary | ICD-10-CM | POA: Diagnosis not present

## 2020-02-18 NOTE — Patient Instructions (Addendum)
General health recommendations:  Continue healthy diet and exercise  Continue current medications and Vitamin D/Calcium supplements  COVID booster w/ Pfizer or Editor, commissioning (Living Will and/or [place]) recommended for all adults over 18    Screening due now or soon:  Colon cancer screening - repeat Cologuard test   Breast cancer screening - due summer 2022  Bone density test - repeat due this month, ordered   Routine screening labs - ordered today

## 2020-02-18 NOTE — Progress Notes (Signed)
HPI: [name] is a 74 y.o. female  who presents to [facility] today, 02/18/20,  for Medicare Annual Wellness Exam  Patient presents for annual physical/Medicare wellness exam. No complaints today.   Past medical, surgical, social and family history reviewed:  Patient Active Problem List   Diagnosis Date Noted  . Skin abnormalities 07/24/2017  . Prediabetes 01/15/2017  . Osteopenia determined by x-ray 02/02/2016  . Vaginal atrophy 12/31/2014  . Esophageal reflux 11/02/2014  . Hematuria 10/15/2013  . FH: kidney cancer  mother 10/15/2013  . Osteopenia 10/16/2012  . Vaginal high risk HPV DNA test positive  repeat 2014  neg  09/13/2011  . History of depression 07/31/2011  . H/O: hysterectomy 07/31/2011  . Hypothyroidism 06/20/2011  . Hyperlipidemia 06/20/2011  . Anxiety 06/20/2011  . VERTIGO 03/16/2009    Past Surgical History:  Procedure Laterality Date  . ABDOMINAL HYSTERECTOMY      Social History   Socioeconomic History  . Marital status: Divorced    Spouse name: Not on file  . Number of children: Not on file  . Years of education: Not on file  . Highest education level: Not on file  Occupational History  . Not on file  Tobacco Use  . Smoking status: Never Smoker  . Smokeless tobacco: Never Used  Vaping Use  . Vaping Use: Never used  Substance and Sexual Activity  . Alcohol use: No    Alcohol/week: 1.0 standard drink    Types: 1 Standard drinks or equivalent per week  . Drug use: No  . Sexual activity: Not Currently  Other Topics Concern  . Not on file  Social History Narrative  . Not on file   Social Determinants of Health   Financial Resource Strain:   . Difficulty of Paying Living Expenses: Not on file  Food Insecurity:   . Worried About Charity fundraiser in the Last Year: Not on file  . Ran Out of Food in the Last Year: Not on file  Transportation Needs:   . Lack of Transportation (Medical): Not on file   . Lack of Transportation (Non-Medical): Not on file  Physical Activity:   . Days of Exercise per Week: Not on file  . Minutes of Exercise per Session: Not on file  Stress:   . Feeling of Stress : Not on file  Social Connections:   . Frequency of Communication with Friends and Family: Not on file  . Frequency of Social Gatherings with Friends and Family: Not on file  . Attends Religious Services: Not on file  . Active Member of Clubs or Organizations: Not on file  . Attends Archivist Meetings: Not on file  . Marital Status: Not on file  Intimate Partner Violence:   . Fear of Current or Ex-Partner: Not on file  . Emotionally Abused: Not on file  . Physically Abused: Not on file  . Sexually Abused: Not on file    Family History  Problem Relation Age of Onset  . Heart disease Mother   . Diabetes Mother   . Hyperlipidemia Mother   . Cancer Mother        kidney  . Heart attack Mother   . Heart disease Father      Current medication list and allergy/intolerance information reviewed:    Outpatient Encounter Medications as of 02/18/2020  Medication Sig  . aspirin 81 MG EC tablet Take 81 mg by mouth daily.    Renard Hamper  Cohosh 40 MG CAPS Take by mouth daily.  . Calcium Carbonate-Vit D-Min (CALCIUM 1200 PO) Take 2 tablets by mouth daily.   . cholecalciferol (VITAMIN D) 1000 UNITS tablet Take 1,000 Units by mouth daily.    . citalopram (CELEXA) 40 MG tablet Take 1 tablet (40 mg total) by mouth daily.  . Cyanocobalamin (B-12) 2500 MCG TABS Take 2 tablets daily by mouth.  . estradiol (ESTRACE) 1 MG tablet Take 1 tablet (1 mg total) by mouth daily.  . famotidine (PEPCID) 20 MG tablet TAKE 1 TABLET TWICE DAILY  . fish oil-omega-3 fatty acids 1000 MG capsule Take 1 capsule by mouth daily.  . Flaxseed, Linseed, (FLAX SEED OIL) 1000 MG CAPS Take 1 capsule by mouth daily.  . fluticasone (FLONASE) 50 MCG/ACT nasal spray USE 1 SPRAY INTO BOTH NOSTRILS DAILY.  Marland Kitchen GARLIC PO Take 1  capsule by mouth.  . levothyroxine (SYNTHROID) 25 MCG tablet Take 1 tablet (25 mcg total) by mouth daily.  . Multiple Vitamin (MULTIVITAMIN) tablet Take 1 tablet by mouth daily.  . Multiple Vitamins-Minerals (HAIR/SKIN/NAILS PO) Take by mouth daily.   No facility-administered encounter medications on file as of 02/18/2020.    Allergies  Allergen Reactions  . Penicillins Other (See Comments)    "I get yeast infections with penicillins"        Review of Systems: Review of Systems - negative   Medicare Wellness Questionnaire  Are there smokers in your home (other than you)? no  Depression Screen (Note: if answer to either of the following is "Yes", a more complete depression screening is indicated)   Q1: Over the past two weeks, have you felt down, depressed or hopeless? no  Q2: Over the past two weeks, have you felt little interest or pleasure in doing things? no  Have you lost interest or pleasure in daily life? no  Do you often feel hopeless? no  Do you cry easily over simple problems? no  Activities of Daily Living In your present state of health, do you have any difficulty performing the following activities?:  Driving? no Managing money?  no Feeding yourself? no Getting from bed to chair? no Climbing a flight of stairs? no Preparing food and eating?: no Bathing or showering? no Getting dressed: no Getting to the toilet? no Using the toilet: no Moving around from place to place: no In the past year have you fallen or had a near fall?: no  Hearing Difficulties:  Do you often ask people to speak up or repeat themselves? no Do you experience ringing or noises in your ears? no  Do you have difficulty understanding soft or whispered voices? no  Memory Difficulties:  Do you feel that you have a problem with memory? no  Do you often misplace items? no  Do you feel safe at home?  yes  Sexual Health:   Are you sexually active?  Yes  Do you have more than one  partner?  No  Advanced Directives:   Advanced directives discussed: has NO advanced directive  - add't info requested. Referral to SW: no  Additional information provided: yes  Risk Factors  Current exercise habits: 3 times per week, involved weight lifting   Dietary issues discussed:no concerns  Cardiac risk factors: positive family history   Exam:  BP 135/82 (BP Location: Left Arm, Patient Position: Sitting, Cuff Size: Normal)   Pulse 83   Temp (!) 97.5 F (36.4 C) (Oral)   Wt 178 lb 0.6 oz (80.8 kg)  BMI 27.89 kg/m  Vision by Snellen chart: right eye:see nurse notes, left eye:see nurse notes  Constitutional: VS see above. General Appearance: alert, well-developed, well-nourished, NAD  Ears, Nose, Mouth, Throat: MMM  Neck: No masses, trachea midline.   Respiratory: Normal respiratory effort. no wheeze, no rhonchi, no rales  Cardiovascular:No lower extremity edema.   Musculoskeletal: Gait normal. No clubbing/cyanosis of digits.   Neurological: Normal balance/coordination. No tremor. Recalls 3 objects and able to read face of watch with correct time.   Skin: warm, dry, intact. No rash/ulcer.   Psychiatric: Normal judgment/insight. Normal mood and affect. Oriented x3.     ASSESSMENT/PLAN:   Encounter for Medicare annual wellness exam  No diagnosis found.  Orders Placed This Encounter  Procedures  . MM 3D SCREEN BREAST BILATERAL  . Cologuard  . CBC  . COMPLETE METABOLIC PANEL WITH GFR  . Lipid panel  . TSH  . Hemoglobin A1c     CANCER SCREENING  Lung - USPSTF: 55-80yo w/ 30 py hx unless quit w/in 51yr - does not need  Colon - needs updated Cologuard   Prostate - does not need  Breast - does not need until 10/2020  Cervical - does not need OTHER DISEASE SCREENING  Lipid - needs  DM2 - needs  AAA - female 76-75yo ever smoked - does not need  Osteoporosis - women 73yo+, men 73yo+ - needs (last T-score -1.5 = osteopenia 02/2018) INFECTIOUS  DISEASE SCREENING  HIV - does not need  GC/CT - does not need  HepC -does not need  TB - does not need ADULT VACCINATION  Influenza - annual vaccine recommended  Td - booster every 10 years - does not need until 2026  Zoster - done  PCV13 - does not need  PPSV23 - does not need Immunization History  Administered Date(s) Administered  . Influenza, High Dose Seasonal PF 11/08/2018  . Influenza,inj,Quad PF,6+ Mos 12/29/2014, 01/05/2016  . Influenza-Unspecified 12/11/2012, 11/08/2017, 11/09/2018  . PFIZER SARS-COV-2 Vaccination 05/04/2019, 05/28/2019  . Pneumococcal Conjugate-13 11/02/2014  . Pneumococcal Polysaccharide-23 10/10/2012  . Tdap 11/02/2014  . Zoster 03/14/2007  . Zoster Recombinat (Shingrix) 02/20/2018, 04/21/2018   OTHER  Fall - exercise and Vit D age 78+ - does not need  Advanced Directives -  Discussed as above  Social History   Tobacco Use  Smoking Status Never Smoker  Smokeless Tobacco Never Used     During the course of the visit the patient was educated and counseled about appropriate screening and preventive services as noted above.   Patient Instructions (the written plan) was given to the patient. Patient Instructions  General health recommendations:  Continue healthy diet and exercise  Continue current medications and Vitamin D/Calcium supplements  COVID booster w/ Pfizer or Editor, commissioning (Living Will and/or [place]) recommended for all adults over 18    Screening due now or soon:  Colon cancer screening - repeat Cologuard test   Breast cancer screening - due summer 2022  Bone density test - repeat due this month, ordered   Routine screening labs - ordered today       Medicare Attestation I have personally reviewed: The patient's medical and social history Their use of alcohol, tobacco or illicit drugs Their current medications and supplements The patient's functional ability  including ADLs,fall risks, home safety risks, cognitive, and hearing and visual impairment Diet and physical activities Evidence for depression or mood disorders  The patient's weight, height, BMI,  and visual acuity have been recorded in the chart.  I have made referrals, counseling, and provided education to the patient based on review of the above and I have provided the patient with a written personalized care plan for preventive services.     Emeterio Reeve, DO   02/18/20   Visit summary with medication list and pertinent instructions was printed for patient to review. All questions at time of visit were answered - patient instructed to contact office with any additional concerns. ER/RTC precautions were reviewed with the patient. Follow-up plan: No follow-ups on file.

## 2020-02-23 DIAGNOSIS — E039 Hypothyroidism, unspecified: Secondary | ICD-10-CM | POA: Diagnosis not present

## 2020-02-23 DIAGNOSIS — R7303 Prediabetes: Secondary | ICD-10-CM | POA: Diagnosis not present

## 2020-02-23 DIAGNOSIS — E7800 Pure hypercholesterolemia, unspecified: Secondary | ICD-10-CM | POA: Diagnosis not present

## 2020-02-23 DIAGNOSIS — Z1231 Encounter for screening mammogram for malignant neoplasm of breast: Secondary | ICD-10-CM | POA: Diagnosis not present

## 2020-02-23 DIAGNOSIS — M8580 Other specified disorders of bone density and structure, unspecified site: Secondary | ICD-10-CM | POA: Diagnosis not present

## 2020-02-23 DIAGNOSIS — Z1211 Encounter for screening for malignant neoplasm of colon: Secondary | ICD-10-CM | POA: Diagnosis not present

## 2020-02-23 DIAGNOSIS — Z0000 Encounter for general adult medical examination without abnormal findings: Secondary | ICD-10-CM | POA: Diagnosis not present

## 2020-02-24 LAB — CBC
HCT: 37.7 % (ref 35.0–45.0)
Hemoglobin: 12.8 g/dL (ref 11.7–15.5)
MCH: 31.5 pg (ref 27.0–33.0)
MCHC: 34 g/dL (ref 32.0–36.0)
MCV: 92.9 fL (ref 80.0–100.0)
MPV: 11.1 fL (ref 7.5–12.5)
Platelets: 268 10*3/uL (ref 140–400)
RBC: 4.06 10*6/uL (ref 3.80–5.10)
RDW: 11.8 % (ref 11.0–15.0)
WBC: 5.6 10*3/uL (ref 3.8–10.8)

## 2020-02-24 LAB — COMPLETE METABOLIC PANEL WITH GFR
AG Ratio: 1.6 (calc) (ref 1.0–2.5)
ALT: 17 U/L (ref 6–29)
AST: 19 U/L (ref 10–35)
Albumin: 4 g/dL (ref 3.6–5.1)
Alkaline phosphatase (APISO): 83 U/L (ref 37–153)
BUN: 14 mg/dL (ref 7–25)
CO2: 30 mmol/L (ref 20–32)
Calcium: 9.1 mg/dL (ref 8.6–10.4)
Chloride: 103 mmol/L (ref 98–110)
Creat: 0.68 mg/dL (ref 0.60–0.93)
GFR, Est African American: 101 mL/min/{1.73_m2} (ref >=60)
GFR, Est Non African American: 87 mL/min/{1.73_m2} (ref >=60)
Globulin: 2.5 g/dL (calc) (ref 1.9–3.7)
Glucose, Bld: 99 mg/dL (ref 65–99)
Potassium: 4.8 mmol/L (ref 3.5–5.3)
Sodium: 139 mmol/L (ref 135–146)
Total Bilirubin: 0.4 mg/dL (ref 0.2–1.2)
Total Protein: 6.5 g/dL (ref 6.1–8.1)

## 2020-02-24 LAB — LIPID PANEL
Cholesterol: 205 mg/dL — ABNORMAL HIGH (ref <200)
HDL: 63 mg/dL (ref >=50)
LDL Cholesterol (Calc): 120 mg/dL (calc) — ABNORMAL HIGH
Non-HDL Cholesterol (Calc): 142 mg/dL (calc) — ABNORMAL HIGH (ref <130)
Total CHOL/HDL Ratio: 3.3 (calc) (ref <5.0)
Triglycerides: 111 mg/dL (ref <150)

## 2020-02-24 LAB — HEMOGLOBIN A1C
Hgb A1c MFr Bld: 6.2 % of total Hgb — ABNORMAL HIGH (ref <5.7)
Mean Plasma Glucose: 131 mg/dL
eAG (mmol/L): 7.3 mmol/L

## 2020-02-24 LAB — TSH: TSH: 6.03 mIU/L — ABNORMAL HIGH (ref 0.40–4.50)

## 2020-02-27 ENCOUNTER — Encounter: Payer: Self-pay | Admitting: Osteopathic Medicine

## 2020-02-27 DIAGNOSIS — E039 Hypothyroidism, unspecified: Secondary | ICD-10-CM

## 2020-02-27 MED ORDER — LEVOTHYROXINE SODIUM 50 MCG PO TABS: 50.0000 ug | ORAL_TABLET | Freq: Every day | ORAL | 0 refills | Status: DC

## 2020-03-10 ENCOUNTER — Ambulatory Visit (INDEPENDENT_AMBULATORY_CARE_PROVIDER_SITE_OTHER): Payer: Medicare Other

## 2020-03-10 ENCOUNTER — Other Ambulatory Visit: Payer: Self-pay

## 2020-03-10 DIAGNOSIS — Z13820 Encounter for screening for osteoporosis: Secondary | ICD-10-CM

## 2020-03-10 DIAGNOSIS — E039 Hypothyroidism, unspecified: Secondary | ICD-10-CM | POA: Diagnosis not present

## 2020-03-10 DIAGNOSIS — R29890 Loss of height: Secondary | ICD-10-CM | POA: Diagnosis not present

## 2020-03-10 DIAGNOSIS — E2839 Other primary ovarian failure: Secondary | ICD-10-CM | POA: Diagnosis not present

## 2020-03-10 DIAGNOSIS — Z780 Asymptomatic menopausal state: Secondary | ICD-10-CM

## 2020-03-10 DIAGNOSIS — M85852 Other specified disorders of bone density and structure, left thigh: Secondary | ICD-10-CM | POA: Diagnosis not present

## 2020-03-18 DIAGNOSIS — Z1211 Encounter for screening for malignant neoplasm of colon: Secondary | ICD-10-CM | POA: Diagnosis not present

## 2020-03-28 LAB — COLOGUARD: Cologuard: POSITIVE — AB

## 2020-04-02 ENCOUNTER — Encounter: Payer: Self-pay | Admitting: Osteopathic Medicine

## 2020-04-02 ENCOUNTER — Telehealth: Payer: Self-pay | Admitting: Osteopathic Medicine

## 2020-04-02 DIAGNOSIS — R195 Other fecal abnormalities: Secondary | ICD-10-CM

## 2020-04-02 NOTE — Telephone Encounter (Signed)
Please call patient: I have received results from cologuard, testing was positive, plan to refer for colonoscopy, I sent MyChart message also to let them know      MyChart message sent:  I received the results of your Cologuard screening. Cologuard positive does NOT mean you have cancer, just means more testing is needed. You need a colonoscopy performed for further workup to rule out colon cancer or other abnormality. I have placed referral for GI consult, they will reach out to you to schedule an appointment. Please let us know if you don't hear back about this within the next 5 business days, or if any other questions or concerns!  -Dr Loni Muse

## 2020-04-02 NOTE — Telephone Encounter (Signed)
Pt has been updated of provider's note regarding Cologuard results. Pt is agreeable with provider's recommendation. Pt will contact the clinic if she does not hear from GI specialist.

## 2020-04-15 DIAGNOSIS — E039 Hypothyroidism, unspecified: Secondary | ICD-10-CM | POA: Diagnosis not present

## 2020-04-16 ENCOUNTER — Other Ambulatory Visit: Payer: Self-pay | Admitting: Osteopathic Medicine

## 2020-04-16 DIAGNOSIS — E039 Hypothyroidism, unspecified: Secondary | ICD-10-CM

## 2020-04-16 LAB — TSH: TSH: 3.21 mIU/L (ref 0.40–4.50)

## 2020-04-16 MED ORDER — LEVOTHYROXINE SODIUM 50 MCG PO TABS: 50.0000 ug | ORAL_TABLET | Freq: Every day | ORAL | 3 refills | Status: DC

## 2020-04-22 DIAGNOSIS — R195 Other fecal abnormalities: Secondary | ICD-10-CM | POA: Diagnosis not present

## 2020-06-07 ENCOUNTER — Other Ambulatory Visit: Payer: Self-pay | Admitting: Osteopathic Medicine

## 2020-06-10 ENCOUNTER — Other Ambulatory Visit: Payer: Self-pay | Admitting: Osteopathic Medicine

## 2020-06-10 DIAGNOSIS — M8580 Other specified disorders of bone density and structure, unspecified site: Secondary | ICD-10-CM

## 2020-06-10 DIAGNOSIS — Z780 Asymptomatic menopausal state: Secondary | ICD-10-CM

## 2020-06-17 DIAGNOSIS — K635 Polyp of colon: Secondary | ICD-10-CM | POA: Diagnosis not present

## 2020-06-17 DIAGNOSIS — K921 Melena: Secondary | ICD-10-CM | POA: Diagnosis not present

## 2020-06-17 DIAGNOSIS — K6389 Other specified diseases of intestine: Secondary | ICD-10-CM | POA: Diagnosis not present

## 2020-06-17 DIAGNOSIS — K648 Other hemorrhoids: Secondary | ICD-10-CM | POA: Diagnosis not present

## 2020-06-17 LAB — HM COLONOSCOPY

## 2020-06-30 ENCOUNTER — Encounter: Payer: Self-pay | Admitting: Osteopathic Medicine

## 2020-07-13 ENCOUNTER — Other Ambulatory Visit: Payer: Self-pay | Admitting: Osteopathic Medicine

## 2020-07-13 DIAGNOSIS — Z780 Asymptomatic menopausal state: Secondary | ICD-10-CM

## 2020-07-13 DIAGNOSIS — E039 Hypothyroidism, unspecified: Secondary | ICD-10-CM

## 2020-07-28 DIAGNOSIS — L720 Epidermal cyst: Secondary | ICD-10-CM | POA: Diagnosis not present

## 2020-07-28 DIAGNOSIS — Z85828 Personal history of other malignant neoplasm of skin: Secondary | ICD-10-CM | POA: Diagnosis not present

## 2020-07-28 DIAGNOSIS — D225 Melanocytic nevi of trunk: Secondary | ICD-10-CM | POA: Diagnosis not present

## 2020-07-28 DIAGNOSIS — L579 Skin changes due to chronic exposure to nonionizing radiation, unspecified: Secondary | ICD-10-CM | POA: Diagnosis not present

## 2020-07-28 DIAGNOSIS — L814 Other melanin hyperpigmentation: Secondary | ICD-10-CM | POA: Diagnosis not present

## 2020-07-28 DIAGNOSIS — L821 Other seborrheic keratosis: Secondary | ICD-10-CM | POA: Diagnosis not present

## 2020-07-28 DIAGNOSIS — D1801 Hemangioma of skin and subcutaneous tissue: Secondary | ICD-10-CM | POA: Diagnosis not present

## 2020-10-07 ENCOUNTER — Other Ambulatory Visit: Payer: Self-pay | Admitting: Osteopathic Medicine

## 2020-10-07 DIAGNOSIS — Z1231 Encounter for screening mammogram for malignant neoplasm of breast: Secondary | ICD-10-CM

## 2020-11-11 DIAGNOSIS — H5213 Myopia, bilateral: Secondary | ICD-10-CM | POA: Diagnosis not present

## 2020-11-11 DIAGNOSIS — H02055 Trichiasis without entropian left lower eyelid: Secondary | ICD-10-CM | POA: Diagnosis not present

## 2020-11-25 ENCOUNTER — Ambulatory Visit
Admission: RE | Admit: 2020-11-25 | Discharge: 2020-11-25 | Disposition: A | Discharge status: Home / Self Care | Payer: Medicare Other | Source: Ambulatory Visit | Attending: Osteopathic Medicine | Admitting: Osteopathic Medicine

## 2020-11-25 DIAGNOSIS — Z1231 Encounter for screening mammogram for malignant neoplasm of breast: Secondary | ICD-10-CM | POA: Diagnosis not present

## 2020-12-06 DIAGNOSIS — Z23 Encounter for immunization: Secondary | ICD-10-CM | POA: Diagnosis not present

## 2020-12-10 DIAGNOSIS — H02054 Trichiasis without entropian left upper eyelid: Secondary | ICD-10-CM | POA: Diagnosis not present

## 2020-12-10 DIAGNOSIS — H02055 Trichiasis without entropian left lower eyelid: Secondary | ICD-10-CM | POA: Diagnosis not present

## 2020-12-10 DIAGNOSIS — Q103 Other congenital malformations of eyelid: Secondary | ICD-10-CM | POA: Diagnosis not present

## 2021-01-25 DIAGNOSIS — Q103 Other congenital malformations of eyelid: Secondary | ICD-10-CM | POA: Diagnosis not present

## 2021-01-25 DIAGNOSIS — H5340 Unspecified visual field defects: Secondary | ICD-10-CM | POA: Diagnosis not present

## 2021-02-16 ENCOUNTER — Other Ambulatory Visit: Payer: Self-pay | Admitting: Osteopathic Medicine

## 2021-02-16 DIAGNOSIS — Z780 Asymptomatic menopausal state: Secondary | ICD-10-CM

## 2021-02-16 DIAGNOSIS — M8580 Other specified disorders of bone density and structure, unspecified site: Secondary | ICD-10-CM

## 2021-02-18 ENCOUNTER — Encounter: Payer: Self-pay | Admitting: Medical-Surgical

## 2021-02-18 ENCOUNTER — Ambulatory Visit (INDEPENDENT_AMBULATORY_CARE_PROVIDER_SITE_OTHER): Payer: Medicare Other | Admitting: Medical-Surgical

## 2021-02-18 ENCOUNTER — Other Ambulatory Visit: Payer: Self-pay

## 2021-02-18 VITALS — BP 130/87 | HR 77 | Resp 20 | Ht 67.0 in | Wt 178.0 lb

## 2021-02-18 DIAGNOSIS — M8580 Other specified disorders of bone density and structure, unspecified site: Secondary | ICD-10-CM | POA: Diagnosis not present

## 2021-02-18 DIAGNOSIS — Z780 Asymptomatic menopausal state: Secondary | ICD-10-CM

## 2021-02-18 DIAGNOSIS — E7800 Pure hypercholesterolemia, unspecified: Secondary | ICD-10-CM | POA: Diagnosis not present

## 2021-02-18 DIAGNOSIS — Z7689 Persons encountering health services in other specified circumstances: Secondary | ICD-10-CM

## 2021-02-18 DIAGNOSIS — F419 Anxiety disorder, unspecified: Secondary | ICD-10-CM | POA: Diagnosis not present

## 2021-02-18 DIAGNOSIS — E782 Mixed hyperlipidemia: Secondary | ICD-10-CM

## 2021-02-18 DIAGNOSIS — E039 Hypothyroidism, unspecified: Secondary | ICD-10-CM

## 2021-02-18 DIAGNOSIS — R030 Elevated blood-pressure reading, without diagnosis of hypertension: Secondary | ICD-10-CM

## 2021-02-18 DIAGNOSIS — R7303 Prediabetes: Secondary | ICD-10-CM

## 2021-02-18 MED ORDER — CITALOPRAM HYDROBROMIDE 40 MG PO TABS
40.0000 mg | ORAL_TABLET | Freq: Every day | ORAL | 1 refills | Status: DC
Start: 2021-02-18 — End: 2021-10-10

## 2021-02-18 MED ORDER — FLUTICASONE PROPIONATE 50 MCG/ACT NA SUSP: NASAL | 1 refills | Status: DC

## 2021-02-18 MED ORDER — ESTRADIOL 1 MG PO TABS: 1.0000 mg | ORAL_TABLET | Freq: Every day | ORAL | 0 refills | Status: DC

## 2021-02-18 MED ORDER — FAMOTIDINE 20 MG PO TABS: 20.0000 mg | ORAL_TABLET | Freq: Two times a day (BID) | ORAL | 1 refills | Status: DC

## 2021-02-18 NOTE — Progress Notes (Deleted)
New Patient Office Visit  Subjective:  Patient ID: [name], female    DOB: July 02, 1945  Age: 75 y.o. MRN: [id number]  CC:  Chief Complaint  Patient presents with   Transitions Of Care     HPI [name] presents to establish care.   Past Medical History:  Diagnosis Date   BCC (basal cell carcinoma of skin) 03/27/2017   Left Lower Leg Behind Knee (tx p bx)   Depression    Hyperlipidemia    Menopause    Nodular basal cell carcinoma (BCC) 01/18/2017   Lower Left Leg (Dr. Sheppard Coil)   Thyroid disease    hypothyroid    Past Surgical History:  Procedure Laterality Date   ABDOMINAL HYSTERECTOMY      Family History  Problem Relation Age of Onset   Heart disease Mother    Diabetes Mother    Hyperlipidemia Mother    Cancer Mother        kidney   Heart attack Mother    Heart disease Father     Social History   Socioeconomic History   Marital status: Divorced    Spouse name: Not on file   Number of children: Not on file   Years of education: Not on file   Highest education level: Not on file  Occupational History   Not on file  Tobacco Use   Smoking status: Never   Smokeless tobacco: Never  Vaping Use   Vaping Use: Never used  Substance and Sexual Activity   Alcohol use: No    Alcohol/week: 1.0 standard drink    Types: 1 Standard drinks or equivalent per week   Drug use: No   Sexual activity: Not Currently  Other Topics Concern   Not on file  Social History Narrative   Not on file   Social Determinants of Health   Financial Resource Strain: Not on file  Food Insecurity: Not on file  Transportation Needs: Not on file  Physical Activity: Not on file  Stress: Not on file  Social Connections: Not on file  Intimate Partner Violence: Not on file    ROS Review of Systems  Objective:   Today's Vitals: BP (!) 145/85 (BP Location: Left Arm, Patient Position: Sitting, Cuff Size: Large)   Pulse 77   Resp 20   Ht 5\' 7"  (1.702 m)   Wt 178 lb (80.7  kg)   SpO2 98%   BMI 27.88 kg/m   Physical Exam  Assessment & Plan:   1. Encounter to establish care ***  2. Prediabetes ***  3. Hypothyroidism, unspecified type ***  4. Anxiety ***  5. Mixed hyperlipidemia ***   Outpatient Encounter Medications as of 02/18/2021  Medication Sig   aspirin 81 MG EC tablet Take 81 mg by mouth daily.     Black Cohosh 40 MG CAPS Take by mouth daily.   Calcium Carbonate-Vit D-Min (CALCIUM 1200 PO) Take 2 tablets by mouth daily.    cholecalciferol (VITAMIN D) 1000 UNITS tablet Take 1,000 Units by mouth daily.     citalopram (CELEXA) 40 MG tablet Take 1 tablet (40 mg total) by mouth daily. NO REFILLS. NEEDS TO TRANSITION CARE TO NEW PCP.   Cyanocobalamin (B-12) 2500 MCG TABS Take 2 tablets daily by mouth.   estradiol (ESTRACE) 1 MG tablet Take 1 tablet (1 mg total) by mouth daily. NO REFILLS. NEEDS TO TRANSITION CARE TO NEW PCP.   famotidine (PEPCID) 20 MG tablet Take 1 tablet (20 mg total)  by mouth 2 (two) times daily. NO REFILLS. NEEDS TO TRANSITION CARE TO NEW PCP.   fish oil-omega-3 fatty acids 1000 MG capsule Take 1 capsule by mouth daily.   Flaxseed, Linseed, (FLAX SEED OIL) 1000 MG CAPS Take 1 capsule by mouth daily.   fluticasone (FLONASE) 50 MCG/ACT nasal spray USE 1 SPRAY INTO BOTH NOSTRILS DAILY.   GARLIC PO Take 1 capsule by mouth.   levothyroxine (SYNTHROID) 50 MCG tablet TAKE 1 TABLET BY MOUTH DAILY (LAB RECHECK DUE MID-FEBRUARY 2022)   Multiple Vitamin (MULTIVITAMIN) tablet Take 1 tablet by mouth daily.   Multiple Vitamins-Minerals (HAIR/SKIN/NAILS PO) Take by mouth daily.   No facility-administered encounter medications on file as of 02/18/2021.    Follow-up: No follow-ups on file.   Clearnce Sorrel, DNP, APRN, FNP-BC [facility]

## 2021-02-18 NOTE — Progress Notes (Signed)
  HPI with pertinent ROS:   CC: transfer of care, chronic disease follow up  HPI: Pleasant 75 year old female presenting to transfer care to a new PCP and for the following:  Hypothyroidism: taking levothyroxine 19mcg daily as prescribed, tolerating well without side effects. Has been on this dose for approximately a year.   Prediabetes: last A1c checked 1 year ago with result of 6.2%.  Anxiety/post-menopausal syndrome- taking Celexa 40mg  daily, tolerating well without side effects. Feels this is keeping her mood stable. Also taking Estrace 1mg  by mouth daily for post-menopausal syndrome and osteoporosis.  HLD- taking fish oil and flax seed oil daily.   Elevated BP- initial check of BP slightly elevated. No previous issue with HTN. Denies CP, SOB, palpitations, lower extremity edema, dizziness, headaches, or vision changes.  I reviewed the past medical history, family history, social history, surgical history, and allergies today and no changes were needed.  Please see the problem list section below in epic for further details.   Physical exam:   General: Well Developed, well nourished, and in no acute distress.  Neuro: Alert and oriented x3.  HEENT: Normocephalic, atraumatic.  Skin: Warm and dry. Cardiac: Regular rate and rhythm, no murmurs rubs or gallops, no lower extremity edema.  Respiratory: Clear to auscultation bilaterally. Not using accessory muscles, speaking in full sentences.  Impression and Recommendations:    1. Encounter to establish care Reviewed available information and discussed care concerns with patient.   2. Prediabetes Checking A1c - Hemoglobin A1c  3. Hypothyroidism, unspecified type Checking TSH. Continue levothyroxine 67mcg daily pending results.  - TSH  4. Anxiety Continue Celexa 40mg  daily.   5. Mixed hyperlipidemia Checking lipid panel.   6. Post-menopausal Continue Celexa and Estrace.  - citalopram (CELEXA) 40 MG tablet; Take 1 tablet  (40 mg total) by mouth daily.  Dispense: 90 tablet; Refill: 1 - estradiol (ESTRACE) 1 MG tablet; Take 1 tablet (1 mg total) by mouth daily.  Dispense: 90 tablet; Refill: 0  7. Osteopenia, unspecified location Continue Estrace.  - estradiol (ESTRACE) 1 MG tablet; Take 1 tablet (1 mg total) by mouth daily.  Dispense: 90 tablet; Refill: 0  8. Elevated blood pressure reading Checking CBC w/diff and CMP.  - COMPLETE METABOLIC PANEL WITH GFR - CBC with Differential  Return in about 6 months (around 08/19/2021) for chronic disease follow. ___________________________________________ Clearnce Sorrel, DNP, APRN, FNP-BC [facility] [place]

## 2021-02-19 LAB — CBC WITH DIFFERENTIAL/PLATELET
Absolute Monocytes: 440 cells/uL (ref 200–950)
Basophils Absolute: 50 cells/uL (ref 0–200)
Basophils Relative: 0.7 %
Eosinophils Absolute: 263 cells/uL (ref 15–500)
Eosinophils Relative: 3.7 %
HCT: 37.9 % (ref 35.0–45.0)
Hemoglobin: 13 g/dL (ref 11.7–15.5)
Lymphs Abs: 2336 cells/uL (ref 850–3900)
MCH: 31.6 pg (ref 27.0–33.0)
MCHC: 34.3 g/dL (ref 32.0–36.0)
MCV: 92.2 fL (ref 80.0–100.0)
MPV: 11.2 fL (ref 7.5–12.5)
Monocytes Relative: 6.2 %
Neutro Abs: 4012 cells/uL (ref 1500–7800)
Neutrophils Relative %: 56.5 %
Platelets: 271 10*3/uL (ref 140–400)
RBC: 4.11 10*6/uL (ref 3.80–5.10)
RDW: 12.4 % (ref 11.0–15.0)
Total Lymphocyte: 32.9 %
WBC: 7.1 10*3/uL (ref 3.8–10.8)

## 2021-02-19 LAB — COMPLETE METABOLIC PANEL WITH GFR
AG Ratio: 1.7 (calc) (ref 1.0–2.5)
ALT: 18 U/L (ref 6–29)
AST: 18 U/L (ref 10–35)
Albumin: 4.3 g/dL (ref 3.6–5.1)
Alkaline phosphatase (APISO): 80 U/L (ref 37–153)
BUN: 13 mg/dL (ref 7–25)
CO2: 28 mmol/L (ref 20–32)
Calcium: 9.2 mg/dL (ref 8.6–10.4)
Chloride: 100 mmol/L (ref 98–110)
Creat: 0.7 mg/dL (ref 0.60–1.00)
Globulin: 2.5 g/dL (calc) (ref 1.9–3.7)
Glucose, Bld: 102 mg/dL — ABNORMAL HIGH (ref 65–99)
Potassium: 4.3 mmol/L (ref 3.5–5.3)
Sodium: 137 mmol/L (ref 135–146)
Total Bilirubin: 0.6 mg/dL (ref 0.2–1.2)
Total Protein: 6.8 g/dL (ref 6.1–8.1)
eGFR: 91 mL/min/{1.73_m2} (ref >=60)

## 2021-02-19 LAB — TSH: TSH: 3.17 mIU/L (ref 0.40–4.50)

## 2021-02-19 LAB — LIPID PANEL
Cholesterol: 227 mg/dL — ABNORMAL HIGH (ref <200)
HDL: 57 mg/dL (ref >=50)
LDL Cholesterol (Calc): 131 mg/dL (calc) — ABNORMAL HIGH
Non-HDL Cholesterol (Calc): 170 mg/dL (calc) — ABNORMAL HIGH (ref <130)
Total CHOL/HDL Ratio: 4 (calc) (ref <5.0)
Triglycerides: 255 mg/dL — ABNORMAL HIGH (ref <150)

## 2021-02-19 LAB — HEMOGLOBIN A1C
Hgb A1c MFr Bld: 6.1 % of total Hgb — ABNORMAL HIGH (ref <5.7)
Mean Plasma Glucose: 128 mg/dL
eAG (mmol/L): 7.1 mmol/L

## 2021-02-23 ENCOUNTER — Ambulatory Visit (INDEPENDENT_AMBULATORY_CARE_PROVIDER_SITE_OTHER): Payer: Medicare Other | Admitting: Medical-Surgical

## 2021-02-23 DIAGNOSIS — Z0000 Encounter for general adult medical examination without abnormal findings: Secondary | ICD-10-CM | POA: Diagnosis not present

## 2021-02-23 NOTE — Progress Notes (Signed)
MEDICARE ANNUAL WELLNESS VISIT  02/23/2021  Telephone Visit Disclaimer This Medicare AWV was conducted by telephone due to national recommendations for restrictions regarding the COVID-19 Pandemic (e.g. social distancing).  I verified, using two identifiers, that I am speaking with [name] or their authorized healthcare agent. I discussed the limitations, risks, security, and privacy concerns of performing an evaluation and management service by telephone and the potential availability of an in-person appointment in the future. The patient expressed understanding and agreed to proceed.  Location of Patient: Home Location of Provider (nurse):  In the office.  Subjective:    [name] is a 75 y.o. female patient of Samuel Bouche, NP who had a Medicare Annual Wellness Visit today via telephone. [name] is Working full time and lives alone. she has 0 children. she reports that she is socially active and does interact with friends/family regularly. she is moderately physically active and enjoys reading and yard work.  Patient Care Team: Samuel Bouche, NP as PCP - General (Nurse Practitioner)  Advanced Directives 02/23/2021  Does Patient Have a Medical Advance Directive? No  Would patient like information on creating a medical advance directive? No - Patient declined    Hospital Utilization Over the Past 12 Months: # of hospitalizations or ER visits: 0 # of surgeries: 0  Review of Systems    Patient reports that her overall health is unchanged compared to last year.  History obtained from chart review and the patient  Patient Reported Readings (BP, Pulse, CBG, Weight, etc) none  Pain Assessment Pain : No/denies pain     Current Medications & Allergies (verified) Allergies as of 02/23/2021       Reactions   Penicillins Other (See Comments)   "I get yeast infections with penicillins"         Medication List        Accurate as of February 23, 2021 10:11 AM. If you  have any questions, ask your nurse or doctor.          aspirin 81 MG EC tablet Take 81 mg by mouth daily.   B-12 2500 MCG Tabs Take 2 tablets daily by mouth.   Black Cohosh 40 MG Caps Take by mouth daily.   CALCIUM 1200 PO Take 2 tablets by mouth daily.   cholecalciferol 1000 units tablet Commonly known as: VITAMIN D Take 1,000 Units by mouth daily.   citalopram 40 MG tablet Commonly known as: CELEXA Take 1 tablet (40 mg total) by mouth daily.   estradiol 1 MG tablet Commonly known as: ESTRACE Take 1 tablet (1 mg total) by mouth daily.   famotidine 20 MG tablet Commonly known as: PEPCID Take 1 tablet (20 mg total) by mouth 2 (two) times daily.   fish oil-omega-3 fatty acids 1000 MG capsule Take 1 capsule by mouth daily.   Flax Seed Oil 1000 MG Caps Take 1 capsule by mouth daily.   fluticasone 50 MCG/ACT nasal spray Commonly known as: FLONASE USE 1 SPRAY INTO BOTH NOSTRILS DAILY.   GARLIC PO Take 1 capsule by mouth.   HAIR/SKIN/NAILS PO Take by mouth daily.   levothyroxine 50 MCG tablet Commonly known as: SYNTHROID TAKE 1 TABLET BY MOUTH DAILY (LAB RECHECK DUE MID-FEBRUARY 2022)   multivitamin tablet Take 1 tablet by mouth daily.        History (reviewed): Past Medical History:  Diagnosis Date   BCC (basal cell carcinoma of skin) 03/27/2017   Left Lower Leg Behind Knee (tx  p bx)   Depression    Hyperlipidemia    Menopause    Nodular basal cell carcinoma (BCC) 01/18/2017   Lower Left Leg (Dr. Sheppard Coil)   Thyroid disease    hypothyroid   Past Surgical History:  Procedure Laterality Date   ABDOMINAL HYSTERECTOMY     Family History  Problem Relation Age of Onset   Heart disease Mother    Diabetes Mother    Hyperlipidemia Mother    Cancer Mother        Dean   Heart attack Mother    Heart disease Father    Social History   Socioeconomic History   Marital status: Divorced    Spouse name: Not on file   Number of children: Not on  file   Years of education: 14   Highest education level: Some college, no degree  Occupational History   Occupation: Cabin crew    Comment: Full time  Tobacco Use   Smoking status: Never   Smokeless tobacco: Never  Vaping Use   Vaping Use: Never used  Substance and Sexual Activity   Alcohol use: No    Alcohol/week: 1.0 standard drink    Types: 1 Standard drinks or equivalent per week   Drug use: No   Sexual activity: Not Currently  Other Topics Concern   Not on file  Social History Narrative   Lives alone. Still working full time. She enjoys reading.    Social Determinants of Health   Financial Resource Strain: Low Risk    Difficulty of Paying Living Expenses: Not hard at all  Food Insecurity: No Food Insecurity   Worried About Charity fundraiser in the Last Year: Never true   [place] in the Last Year: Never true  Transportation Needs: No Transportation Needs   Lack of Transportation (Medical): No   Lack of Transportation (Non-Medical): No  Physical Activity: Sufficiently Active   Days of Exercise per Week: 7 days   Minutes of Exercise per Session: 40 min  Stress: No Stress Concern Present   Feeling of Stress : Not at all  Social Connections: Moderately Isolated   Frequency of Communication with Friends and Family: More than three times a week   Frequency of Social Gatherings with Friends and Family: Once a week   Attends Religious Services: More than 4 times per year   Active Member of [employer] or Organizations: No   Attends Archivist Meetings: Never   Marital Status: Divorced    Activities of Daily Living In your present state of health, do you have any difficulty performing the following activities: 02/23/2021 02/18/2021  Hearing? N N  Vision? N N  Difficulty concentrating or making decisions? N N  Walking or climbing stairs? N N  Dressing or bathing? N N  Doing errands, shopping? N N  Preparing Food and eating ? N -  Using the Toilet? N -  In  the past six months, have you accidently leaked urine? Y -  Comment She uses the pads. -  Do you have problems with loss of bowel control? N -  Managing your Medications? N -  Managing your Finances? N -  Housekeeping or managing your Housekeeping? N -  Some recent data might be hidden    Patient Education/ Literacy How often do you need to have someone help you when you read instructions, pamphlets, or other written materials from your doctor or pharmacy?: 1 - Never What is the last grade level you  completed in school?: Highschool and two years of college  Exercise Current Exercise Habits: Home exercise routine;Structured exercise class, Type of exercise: strength training/weights;walking, Time (Minutes): 40, Frequency (Times/Week): 7, Weekly Exercise (Minutes/Week): 280, Intensity: Moderate, Exercise limited by: None identified  Diet Patient reports consuming 2 meals a day and 1 snack(s) a day Patient reports that her primary diet is: Regular Patient reports that she does have regular access to food.   Depression Screen PHQ 2/9 Scores 02/23/2021 02/18/2021 02/17/2019 01/15/2017 10/15/2013  PHQ - 2 Score 0 0 0 0 3  PHQ- 9 Score - - 0 - 3     Fall Risk Fall Risk  02/23/2021 02/18/2021 02/04/2019 09/29/2016 10/15/2013  Falls in the past year? 0 0 0 No No  Comment - - Emmi Telephone Survey: data to providers prior to load [employer] Telephone Survey: data to providers prior to load -  Number falls in past yr: 0 0 - - -  Injury with Fall? 0 0 - - -  Risk for fall due to : No Fall Risks No Fall Risks - - -  Follow up Falls evaluation completed Falls evaluation completed - - -     Objective:  [name] seemed alert and oriented and she participated appropriately during our telephone visit.  Blood Pressure Weight BMI  BP Readings from Last 3 Encounters:  02/18/21 130/87  02/18/20 135/82  02/17/19 126/69   Wt Readings from Last 3 Encounters:  02/18/21 178 lb (80.7 kg)  02/18/20 178 lb 0.6  oz (80.8 kg)  02/17/19 180 lb 1.9 oz (81.7 kg)   BMI Readings from Last 1 Encounters:  02/18/21 27.88 kg/m    *Unable to obtain current vital signs, weight, and BMI due to telephone visit type  Hearing/Vision  [name] did not seem to have difficulty with hearing/understanding during the telephone conversation Reports that she has had a formal eye exam by an eye care professional within the past year Reports that she has not had a formal hearing evaluation within the past year *Unable to fully assess hearing and vision during telephone visit type  Cognitive Function: 6CIT Screen 02/23/2021  What Year? 0 points  What month? 0 points  What time? 0 points  Count back from 20 0 points  Months in reverse 0 points  Repeat phrase 0 points  Total Score 0   (Normal:0-7, Significant for Dysfunction: >8)  Normal Cognitive Function Screening: Yes   Immunization & Health Maintenance Record Immunization History  Administered Date(s) Administered   Influenza, High Dose Seasonal PF 11/08/2018   Influenza,inj,Quad PF,6+ Mos 12/29/2014, 01/05/2016   Influenza-Unspecified 12/11/2012, 11/08/2017, 11/09/2018, 11/11/2020   PFIZER(Purple Top)SARS-COV-2 Vaccination 05/04/2019, 05/28/2019   Pneumococcal Conjugate-13 11/02/2014   Pneumococcal Polysaccharide-23 10/10/2012   Tdap 11/02/2014   Zoster Recombinat (Shingrix) 02/20/2018, 04/21/2018   Zoster, Live 03/14/2007    Health Maintenance  Topic Date Due   MAMMOGRAM  11/25/2021   Fecal DNA (Cologuard)  03/19/2023   TETANUS/TDAP  11/01/2024   Pneumonia Vaccine 18+ Years old  Completed   INFLUENZA VACCINE  Completed   DEXA SCAN  Completed   Hepatitis C Screening  Completed   Zoster Vaccines- Shingrix  Completed   HPV VACCINES  Aged Out   COVID-19 Vaccine  Discontinued  Colonoscopy - 06/17/20      Assessment  This is a routine wellness examination for [place] Maintenance: Due or Overdue There are no preventive care reminders  to display for this patient.  [name]  R Cypert does not need a referral for [employer] Assistance: Care Management:   no Social Work:    no Prescription Assistance:  no Nutrition/Diabetes Education:  no   Plan:  Personalized Goals  Goals Addressed               This Visit's Progress     Patient Stated (pt-stated)        Loose any 10 lbs.       Personalized Health Maintenance & Screening Recommendations  There are no preventive care reminders at this time.  Lung Cancer Screening Recommended: no (Low Dose CT Chest recommended if Age 75-80 years, 30 pack-year currently smoking OR have quit w/in past 15 years) Hepatitis C Screening recommended: no HIV Screening recommended: no  Advanced Directives: Written information was prepared per patient's request.  Referrals & Orders No orders of the defined types were placed in this encounter.   Follow-up Plan Follow-up with Samuel Bouche, NP as planned Medicare wellness visit in one year. Patient will access AVS on mychart.   I have personally reviewed and noted the following in the patients chart:   Medical and social history Use of alcohol, tobacco or illicit drugs  Current medications and supplements Functional ability and status Nutritional status Physical activity Advanced directives List of other physicians Hospitalizations, surgeries, and ER visits in previous 12 months Vitals Screenings to include cognitive, depression, and falls Referrals and appointments  In addition, I have reviewed and discussed with [name] certain preventive protocols, quality metrics, and best practice recommendations. A written personalized care plan for preventive services as well as general preventive health recommendations is available and can be mailed to the patient at her request.      Tinnie Gens, RN  02/23/2021

## 2021-02-23 NOTE — Patient Instructions (Addendum)
[place] Maintenance Summary and Written Plan of Care  [name] ,  Thank you for allowing me to perform your Medicare Annual Wellness Visit and for your ongoing commitment to your health.   Health Maintenance & Immunization History Health Maintenance  Topic Date Due   MAMMOGRAM  11/25/2021   Fecal DNA (Cologuard)  03/19/2023   TETANUS/TDAP  11/01/2024   Pneumonia Vaccine 67+ Years old  Completed   INFLUENZA VACCINE  Completed   DEXA SCAN  Completed   Hepatitis C Screening  Completed   Zoster Vaccines- Shingrix  Completed   HPV VACCINES  Aged Out   COVID-19 Vaccine  Discontinued  Colonoscopy - 06/17/20    Immunization History  Administered Date(s) Administered   Influenza, High Dose Seasonal PF 11/08/2018   Influenza,inj,Quad PF,6+ Mos 12/29/2014, 01/05/2016   Influenza-Unspecified 12/11/2012, 11/08/2017, 11/09/2018, 11/11/2020   PFIZER(Purple Top)SARS-COV-2 Vaccination 05/04/2019, 05/28/2019   Pneumococcal Conjugate-13 11/02/2014   Pneumococcal Polysaccharide-23 10/10/2012   Tdap 11/02/2014   Zoster Recombinat (Shingrix) 02/20/2018, 04/21/2018   Zoster, Live 03/14/2007    These are the patient goals that we discussed:  Goals Addressed              This Visit's Progress     Patient Stated (pt-stated)        Loose any 10 lbs.        This is a list of Health Maintenance Items that are overdue or due now: There are no preventive care reminders to display for this patient.  Orders/Referrals Placed Today: No orders of the defined types were placed in this encounter.  (Contact our referral department at [phone] if you have not spoken with someone about your referral appointment within the next 5 days)    Follow-up Plan Follow-up with Samuel Bouche, NP as planned Medicare wellness visit in one year. Patient will access AVS on mychart.      Health Maintenance, Female Adopting a healthy lifestyle and  getting preventive care are important in promoting health and wellness. Ask your health care provider about: The right schedule for you to have regular tests and exams. Things you can do on your own to prevent diseases and keep yourself healthy. What should I know about diet, weight, and exercise? Eat a healthy diet  Eat a diet that includes plenty of vegetables, fruits, low-fat dairy products, and lean protein. Do not eat a lot of foods that are high in solid fats, added sugars, or sodium. Maintain a healthy weight Body mass index (BMI) is used to identify weight problems. It estimates body fat based on height and weight. Your health care provider can help determine your BMI and help you achieve or maintain a healthy weight. Get regular exercise Get regular exercise. This is one of the most important things you can do for your health. Most adults should: Exercise for at least 150 minutes each week. The exercise should increase your heart rate and make you sweat (moderate-intensity exercise). Do strengthening exercises at least twice a week. This is in addition to the moderate-intensity exercise. Spend less time sitting. Even light physical activity can be beneficial. Watch cholesterol and blood lipids Have your blood tested for lipids and cholesterol at 74 years of age, then have this test every 5 years. Have your cholesterol levels checked more often if: Your lipid or cholesterol levels are high. You are older than 74 years of age. You are at high risk for heart disease. What should I know about cancer  screening? Depending on your health history and family history, you may need to have cancer screening at various ages. This may include screening for: Breast cancer. Cervical cancer. Colorectal cancer. Skin cancer. Lung cancer. What should I know about heart disease, diabetes, and high blood pressure? Blood pressure and heart disease High blood pressure causes heart disease and  increases the risk of stroke. This is more likely to develop in people who have high blood pressure readings or are overweight. Have your blood pressure checked: Every 3-5 years if you are 3-60 years of age. Every year if you are 40 years old or older. Diabetes Have regular diabetes screenings. This checks your fasting blood sugar level. Have the screening done: Once every three years after age 57 if you are at a normal weight and have a low risk for diabetes. More often and at a younger age if you are overweight or have a high risk for diabetes. What should I know about preventing infection? Hepatitis B If you have a higher risk for hepatitis B, you should be screened for this virus. Talk with your health care provider to find out if you are at risk for hepatitis B infection. Hepatitis C Testing is recommended for: Everyone born from [birth year] through 1965. Anyone with known risk factors for hepatitis C. Sexually transmitted infections (STIs) Get screened for STIs, including gonorrhea and chlamydia, if: You are sexually active and are younger than 74 years of age. You are older than 74 years of age and your health care provider tells you that you are at risk for this type of infection. Your sexual activity has changed since you were last screened, and you are at increased risk for chlamydia or gonorrhea. Ask your health care provider if you are at risk. Ask your health care provider about whether you are at high risk for HIV. Your health care provider may recommend a prescription medicine to help prevent HIV infection. If you choose to take medicine to prevent HIV, you should first get tested for HIV. You should then be tested every 3 months for as long as you are taking the medicine. Pregnancy If you are about to stop having your period (premenopausal) and you may become pregnant, seek counseling before you get pregnant. Take 400 to 800 micrograms (mcg) of folic acid every day if you become  pregnant. Ask for birth control (contraception) if you want to prevent pregnancy. Osteoporosis and menopause Osteoporosis is a disease in which the bones lose minerals and strength with aging. This can result in bone fractures. If you are 72 years old or older, or if you are at risk for osteoporosis and fractures, ask your health care provider if you should: Be screened for bone loss. Take a calcium or vitamin D supplement to lower your risk of fractures. Be given hormone replacement therapy (HRT) to treat symptoms of menopause. Follow these instructions at home: Alcohol use Do not drink alcohol if: Your health care provider tells you not to drink. You are pregnant, may be pregnant, or are planning to become pregnant. If you drink alcohol: Limit how much you have to: 0-1 drink a day. Know how much alcohol is in your drink. In the [place] one drink equals one 12 oz bottle of beer (355 mL), one 5 oz glass of wine (148 mL), or one 1 oz glass of hard liquor (44 mL). Lifestyle Do not use any products that contain nicotine or tobacco. These products include cigarettes, chewing tobacco, and vaping devices, such  as e-cigarettes. If you need help quitting, ask your health care provider. Do not use street drugs. Do not share needles. Ask your health care provider for help if you need support or information about quitting drugs. General instructions Schedule regular health, dental, and eye exams. Stay current with your vaccines. Tell your health care provider if: You often feel depressed. You have ever been abused or do not feel safe at home. Summary Adopting a healthy lifestyle and getting preventive care are important in promoting health and wellness. Follow your health care provider's instructions about healthy diet, exercising, and getting tested or screened for diseases. Follow your health care provider's instructions on monitoring your cholesterol and blood pressure. This information is not  intended to replace advice given to you by your health care provider. Make sure you discuss any questions you have with your health care provider. Document Revised: 07/19/2020 Document Reviewed: 07/19/2020 Elsevier Patient Education  [place].

## 2021-03-20 NOTE — Progress Notes (Signed)
Erroneous encounter. Please disregard.

## 2021-03-21 ENCOUNTER — Encounter: Payer: Medicare Other | Admitting: Medical-Surgical

## 2021-03-21 DIAGNOSIS — R42 Dizziness and giddiness: Secondary | ICD-10-CM

## 2021-03-22 DIAGNOSIS — H02052 Trichiasis without entropian right lower eyelid: Secondary | ICD-10-CM | POA: Diagnosis not present

## 2021-03-22 DIAGNOSIS — Q103 Other congenital malformations of eyelid: Secondary | ICD-10-CM | POA: Diagnosis not present

## 2021-03-22 DIAGNOSIS — H02054 Trichiasis without entropian left upper eyelid: Secondary | ICD-10-CM | POA: Diagnosis not present

## 2021-05-18 ENCOUNTER — Encounter: Payer: Self-pay | Admitting: Medical-Surgical

## 2021-05-18 DIAGNOSIS — E039 Hypothyroidism, unspecified: Secondary | ICD-10-CM

## 2021-05-18 MED ORDER — LEVOTHYROXINE SODIUM 50 MCG PO TABS: 50.0000 ug | ORAL_TABLET | Freq: Every day | ORAL | 0 refills | Status: DC

## 2021-06-10 ENCOUNTER — Other Ambulatory Visit: Payer: Self-pay | Admitting: Medical-Surgical

## 2021-06-10 DIAGNOSIS — M8580 Other specified disorders of bone density and structure, unspecified site: Secondary | ICD-10-CM

## 2021-06-10 DIAGNOSIS — Z780 Asymptomatic menopausal state: Secondary | ICD-10-CM

## 2021-07-14 ENCOUNTER — Other Ambulatory Visit: Payer: Self-pay | Admitting: Medical-Surgical

## 2021-08-03 DIAGNOSIS — L814 Other melanin hyperpigmentation: Secondary | ICD-10-CM | POA: Diagnosis not present

## 2021-08-03 DIAGNOSIS — Z85828 Personal history of other malignant neoplasm of skin: Secondary | ICD-10-CM | POA: Diagnosis not present

## 2021-08-03 DIAGNOSIS — D225 Melanocytic nevi of trunk: Secondary | ICD-10-CM | POA: Diagnosis not present

## 2021-08-03 DIAGNOSIS — D235 Other benign neoplasm of skin of trunk: Secondary | ICD-10-CM | POA: Diagnosis not present

## 2021-08-03 DIAGNOSIS — L821 Other seborrheic keratosis: Secondary | ICD-10-CM | POA: Diagnosis not present

## 2021-08-03 DIAGNOSIS — L579 Skin changes due to chronic exposure to nonionizing radiation, unspecified: Secondary | ICD-10-CM | POA: Diagnosis not present

## 2021-08-12 ENCOUNTER — Other Ambulatory Visit: Payer: Self-pay | Admitting: Osteopathic Medicine

## 2021-08-12 DIAGNOSIS — E039 Hypothyroidism, unspecified: Secondary | ICD-10-CM

## 2021-08-19 ENCOUNTER — Encounter: Payer: Self-pay | Admitting: Medical-Surgical

## 2021-08-19 ENCOUNTER — Ambulatory Visit (INDEPENDENT_AMBULATORY_CARE_PROVIDER_SITE_OTHER): Payer: Medicare Other | Admitting: Medical-Surgical

## 2021-08-19 VITALS — BP 132/88 | HR 76 | Resp 20 | Ht 67.0 in | Wt 185.4 lb

## 2021-08-19 DIAGNOSIS — E782 Mixed hyperlipidemia: Secondary | ICD-10-CM | POA: Diagnosis not present

## 2021-08-19 DIAGNOSIS — R030 Elevated blood-pressure reading, without diagnosis of hypertension: Secondary | ICD-10-CM

## 2021-08-19 DIAGNOSIS — R7303 Prediabetes: Secondary | ICD-10-CM | POA: Diagnosis not present

## 2021-08-19 DIAGNOSIS — F419 Anxiety disorder, unspecified: Secondary | ICD-10-CM | POA: Diagnosis not present

## 2021-08-19 DIAGNOSIS — E039 Hypothyroidism, unspecified: Secondary | ICD-10-CM | POA: Diagnosis not present

## 2021-08-19 NOTE — Patient Instructions (Signed)
Berberine

## 2021-08-19 NOTE — Progress Notes (Signed)
Established Patient Office Visit  Subjective   Patient ID: [name], female   DOB: 06/17/1945 Age: 76 y.o. MRN: [id number]   Chief Complaint  Patient presents with   Follow-up    HPI Pleasant 76 year old female presenting today for follow-up on:  Hypothyroidism: taking levothyroxine '50mg'$  daily, tolerating well without side effects. Does have some issues with fatigue.  No heat/cold intolerance, hair changes, skin changes, tremors, or worsening anxiety.  Prediabetes: not as stringent with her diet. Has gained about 7lbs since her last visit which does not make her happy.  Anxiety: taking Celexa '40mg'$  daily, tolerating well without side effects. Symptoms stable. Denies SI/HI.   Hyperlipidemia: history of elevated cholesterol with high ASCVD risk score. Has declined statin to date and prefers to use diet/lifestyle modifications as well as herbal options to try to reduce her cholesterol. Unsure why she doesn't want to try a statin but thinks it's because she has hears so many bad reports about it.   Elevated blood pressure-she has had a couple of blood pressures that are elevated in the past however they typically come down.  Today she is at the borderline at 132/88.  Denies CP, SOB, palpitations, lower extremity edema, dizziness, headaches, or vision changes.   Objective:    Vitals:   08/19/21 1456  BP: 132/88  Pulse: 76  Resp: 20  Height: '5\' 7"'$  (1.702 m)  Weight: 185 lb 6.4 oz (84.1 kg)  SpO2: 96%  BMI (Calculated): 29.03   Physical Exam Vitals and nursing note reviewed.  Constitutional:      General: She is not in acute distress.    Appearance: Normal appearance. She is not ill-appearing.  HENT:     Head: Normocephalic and atraumatic.  Cardiovascular:     Rate and Rhythm: Normal rate and regular rhythm.     Pulses: Normal pulses.     Heart sounds: Normal heart sounds.  Pulmonary:     Effort: Pulmonary effort is normal. No respiratory distress.     Breath sounds:  Normal breath sounds. No wheezing, rhonchi or rales.  Skin:    General: Skin is warm and dry.  Neurological:     Mental Status: She is alert and oriented to person, place, and time.  Psychiatric:        Mood and Affect: Mood normal.        Behavior: Behavior normal.        Thought Content: Thought content normal.        Judgment: Judgment normal.   No results found for this or any previous visit (from the past 24 hour(s)).     The 10-year ASCVD risk score (Arnett DK, et al., 2019) is: 15.5%   Values used to calculate the score:     Age: 31 years     Sex: Female     Is Non-Hispanic African American: No     Diabetic: No     Tobacco smoker: No     Systolic Blood Pressure: 132 mmHg     Is BP treated: No     HDL Cholesterol: 57 mg/dL     Total Cholesterol: 227 mg/dL   Assessment & Plan:   1. Hypothyroidism, unspecified type Checking TSH today.  Continue levothyroxine 50 mcg daily, titrate pending lab results. - TSH  2. Anxiety Stable.  Continue Celexa 40 mg daily.  3. Mixed hyperlipidemia Checking lipid panel today.  Discussed the importance of reducing her risk of cardiovascular complications with diet and lifestyle changes  but also recommend treating with a statin medication.  For now, consider adding berberine as long as this does not interfere with her other medications.  This has been known to help treat glucose elevations as well as cholesterol.  Advised that all medications and herbal supplements have the potential to cause significant side effects and she should proceed with caution and seek out a reputable manufacturer.  After discussion, patient reports that she would like to give it another 6 months and if her cholesterol is still significantly elevated in December, she will consider starting a statin medication. - Lipid panel  4. Prediabetes Checking hemoglobin A1c.  See above information regarding berberine. - Hemoglobin A1c  5. Elevated blood pressure  reading Checking CBC with differential and CMP.  Borderline today.  We will avoid starting medication but do recommend continued efforts at diet and exercise for weight loss.  Also recommend low-sodium diet. - CBC with Differential/Platelet - COMPLETE METABOLIC PANEL WITH GFR  Return in about 6 months (around 02/18/2022) for chronic disease follow up.  ___________________________________________ Clearnce Sorrel, DNP, APRN, FNP-BC [facility]

## 2021-08-24 DIAGNOSIS — R030 Elevated blood-pressure reading, without diagnosis of hypertension: Secondary | ICD-10-CM | POA: Diagnosis not present

## 2021-08-24 DIAGNOSIS — R7303 Prediabetes: Secondary | ICD-10-CM | POA: Diagnosis not present

## 2021-08-24 DIAGNOSIS — E039 Hypothyroidism, unspecified: Secondary | ICD-10-CM | POA: Diagnosis not present

## 2021-08-24 DIAGNOSIS — E782 Mixed hyperlipidemia: Secondary | ICD-10-CM | POA: Diagnosis not present

## 2021-08-25 LAB — COMPLETE METABOLIC PANEL WITH GFR
AG Ratio: 1.7 (calc) (ref 1.0–2.5)
ALT: 14 U/L (ref 6–29)
AST: 16 U/L (ref 10–35)
Albumin: 4.2 g/dL (ref 3.6–5.1)
Alkaline phosphatase (APISO): 85 U/L (ref 37–153)
BUN: 16 mg/dL (ref 7–25)
CO2: 25 mmol/L (ref 20–32)
Calcium: 9.3 mg/dL (ref 8.6–10.4)
Chloride: 102 mmol/L (ref 98–110)
Creat: 0.69 mg/dL (ref 0.60–1.00)
Globulin: 2.5 g/dL (calc) (ref 1.9–3.7)
Glucose, Bld: 100 mg/dL — ABNORMAL HIGH (ref 65–99)
Potassium: 4.5 mmol/L (ref 3.5–5.3)
Sodium: 136 mmol/L (ref 135–146)
Total Bilirubin: 0.5 mg/dL (ref 0.2–1.2)
Total Protein: 6.7 g/dL (ref 6.1–8.1)
eGFR: 91 mL/min/{1.73_m2} (ref >=60)

## 2021-08-25 LAB — LIPID PANEL
Cholesterol: 205 mg/dL — ABNORMAL HIGH (ref <200)
HDL: 61 mg/dL (ref >=50)
LDL Cholesterol (Calc): 124 mg/dL (calc) — ABNORMAL HIGH
Non-HDL Cholesterol (Calc): 144 mg/dL (calc) — ABNORMAL HIGH (ref <130)
Total CHOL/HDL Ratio: 3.4 (calc) (ref <5.0)
Triglycerides: 101 mg/dL (ref <150)

## 2021-08-25 LAB — CBC WITH DIFFERENTIAL/PLATELET
Absolute Monocytes: 340 cells/uL (ref 200–950)
Basophils Absolute: 59 cells/uL (ref 0–200)
Basophils Relative: 1.1 %
Eosinophils Absolute: 211 cells/uL (ref 15–500)
Eosinophils Relative: 3.9 %
HCT: 39.7 % (ref 35.0–45.0)
Hemoglobin: 13.4 g/dL (ref 11.7–15.5)
Lymphs Abs: 1782 cells/uL (ref 850–3900)
MCH: 31.2 pg (ref 27.0–33.0)
MCHC: 33.8 g/dL (ref 32.0–36.0)
MCV: 92.3 fL (ref 80.0–100.0)
MPV: 10.9 fL (ref 7.5–12.5)
Monocytes Relative: 6.3 %
Neutro Abs: 3008 cells/uL (ref 1500–7800)
Neutrophils Relative %: 55.7 %
Platelets: 260 10*3/uL (ref 140–400)
RBC: 4.3 10*6/uL (ref 3.80–5.10)
RDW: 12.6 % (ref 11.0–15.0)
Total Lymphocyte: 33 %
WBC: 5.4 10*3/uL (ref 3.8–10.8)

## 2021-08-25 LAB — HEMOGLOBIN A1C
Hgb A1c MFr Bld: 6.1 % of total Hgb — ABNORMAL HIGH (ref <5.7)
Mean Plasma Glucose: 128 mg/dL
eAG (mmol/L): 7.1 mmol/L

## 2021-08-25 LAB — TSH: TSH: 5.28 mIU/L — ABNORMAL HIGH (ref 0.40–4.50)

## 2021-10-10 ENCOUNTER — Other Ambulatory Visit: Payer: Self-pay | Admitting: Medical-Surgical

## 2021-10-10 DIAGNOSIS — Z780 Asymptomatic menopausal state: Secondary | ICD-10-CM

## 2021-10-24 ENCOUNTER — Other Ambulatory Visit: Payer: Self-pay | Admitting: Medical-Surgical

## 2021-10-24 DIAGNOSIS — Z1231 Encounter for screening mammogram for malignant neoplasm of breast: Secondary | ICD-10-CM

## 2021-10-26 ENCOUNTER — Other Ambulatory Visit: Payer: Self-pay | Admitting: Medical-Surgical

## 2021-10-26 DIAGNOSIS — E039 Hypothyroidism, unspecified: Secondary | ICD-10-CM

## 2021-11-03 ENCOUNTER — Other Ambulatory Visit: Payer: Self-pay | Admitting: Medical-Surgical

## 2021-11-03 DIAGNOSIS — Z780 Asymptomatic menopausal state: Secondary | ICD-10-CM

## 2021-11-03 DIAGNOSIS — M8580 Other specified disorders of bone density and structure, unspecified site: Secondary | ICD-10-CM

## 2021-11-28 ENCOUNTER — Ambulatory Visit
Admission: RE | Admit: 2021-11-28 | Discharge: 2021-11-28 | Disposition: A | Discharge status: Home / Self Care | Payer: Medicare Other | Source: Ambulatory Visit | Attending: Medical-Surgical | Admitting: Medical-Surgical

## 2021-11-28 DIAGNOSIS — Z1231 Encounter for screening mammogram for malignant neoplasm of breast: Secondary | ICD-10-CM

## 2021-12-08 ENCOUNTER — Other Ambulatory Visit: Payer: Self-pay | Admitting: Medical-Surgical

## 2021-12-08 ENCOUNTER — Encounter: Payer: Self-pay | Admitting: Medical-Surgical

## 2021-12-08 DIAGNOSIS — M8580 Other specified disorders of bone density and structure, unspecified site: Secondary | ICD-10-CM

## 2021-12-08 DIAGNOSIS — Z780 Asymptomatic menopausal state: Secondary | ICD-10-CM

## 2021-12-17 ENCOUNTER — Other Ambulatory Visit: Payer: Self-pay | Admitting: Medical-Surgical

## 2021-12-17 DIAGNOSIS — Z780 Asymptomatic menopausal state: Secondary | ICD-10-CM

## 2022-01-29 IMAGING — MG MM DIGITAL SCREENING BILAT W/ TOMO AND CAD
8 series · 8 of 24 positions shown · non-contrast
Comparison: Previous exam(s).

CLINICAL DATA: Screening.

EXAM:
DIGITAL SCREENING BILATERAL MAMMOGRAM WITH TOMOSYNTHESIS AND CAD
TECHNIQUE: Bilateral screening digital craniocaudal and mediolateral oblique
mammograms were obtained. Bilateral screening digital breast
tomosynthesis was performed. The images were evaluated with
computer-aided detection.

[R MLO synth-2D]
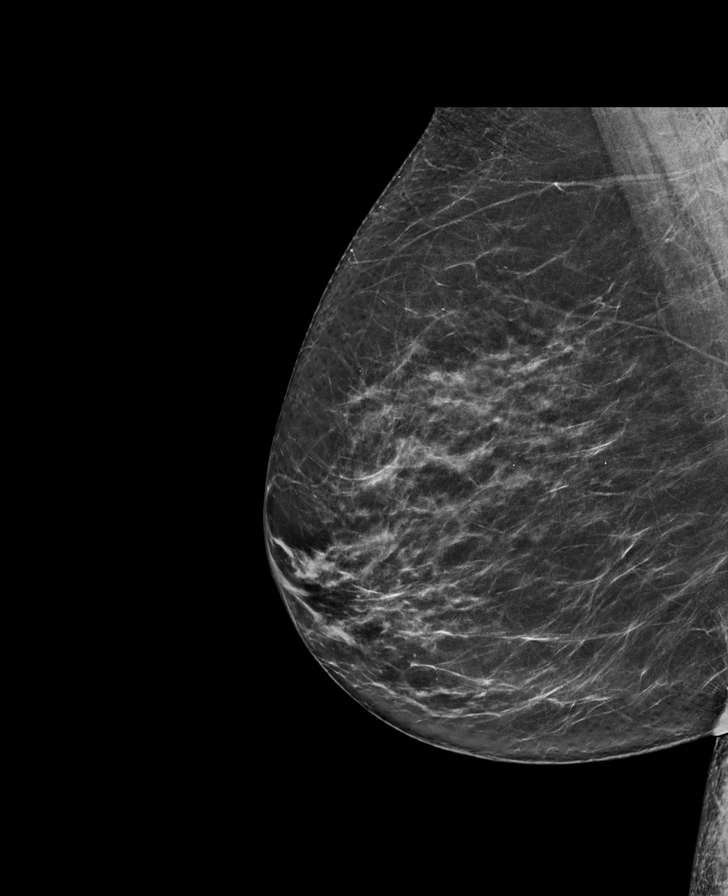

[R CC synth-2D]
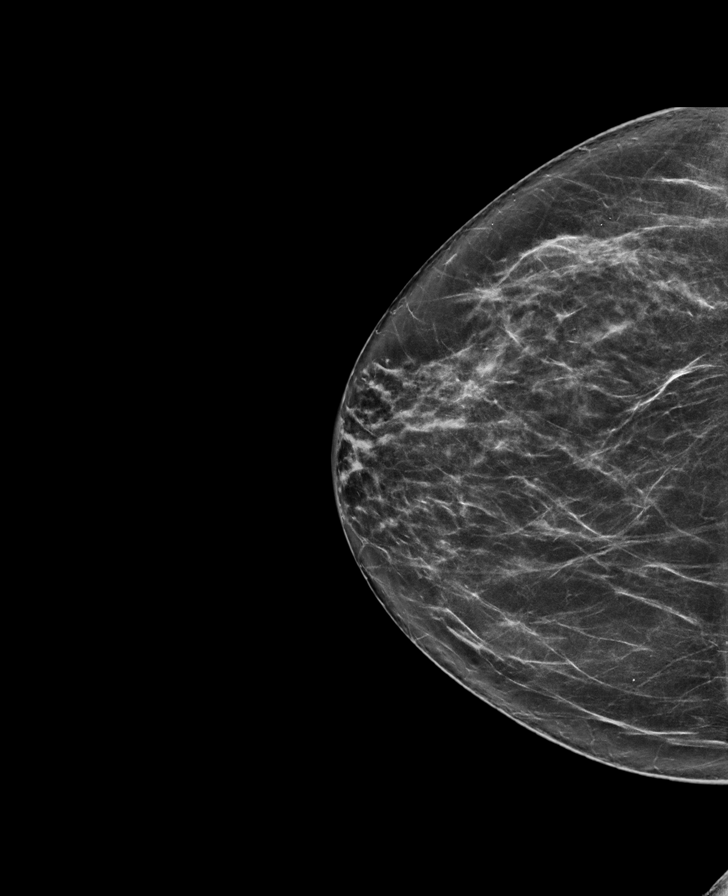

[L CC synth-2D]
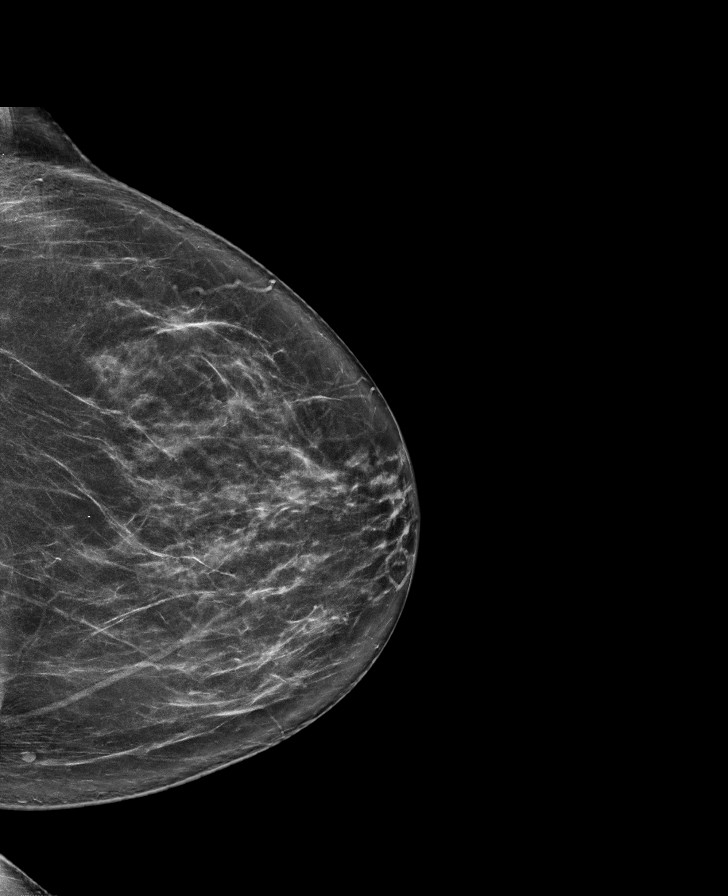

[L MLO synth-2D]
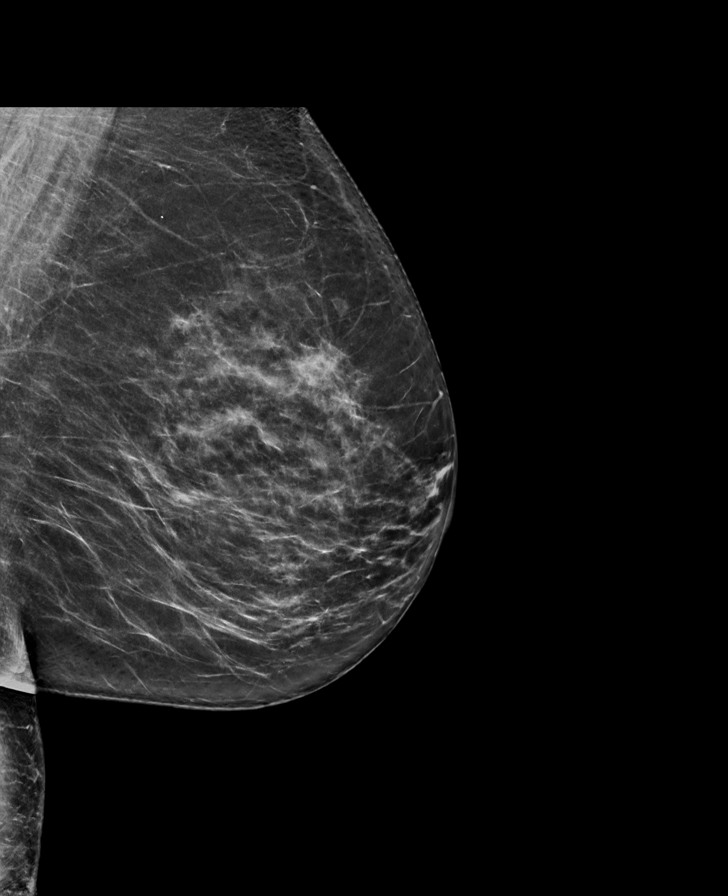

[R MLO tomo · tomo slice 37/74.0]
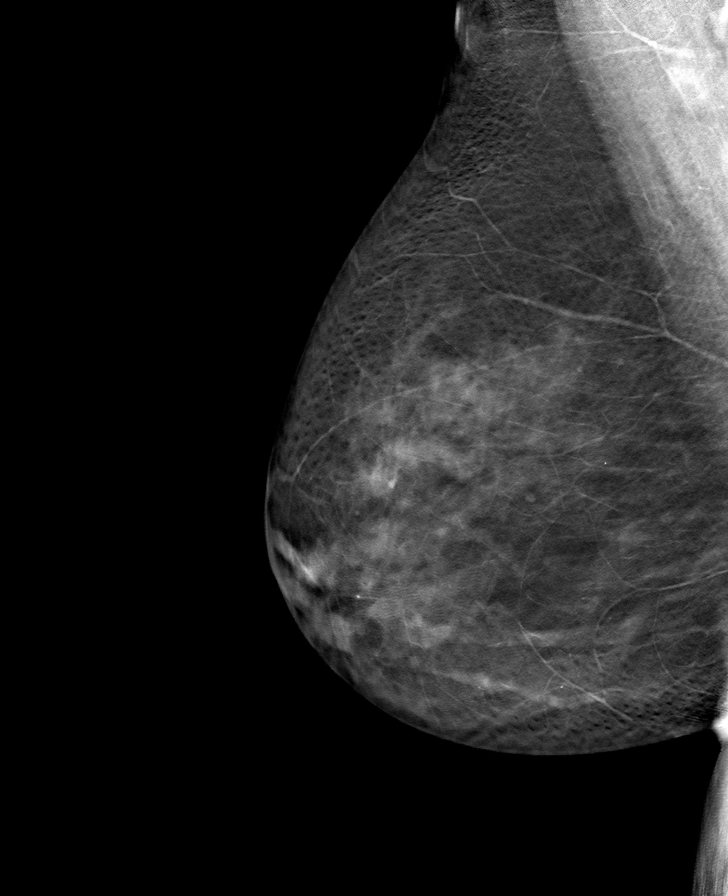

[L CC tomo · tomo slice 40/79.0]
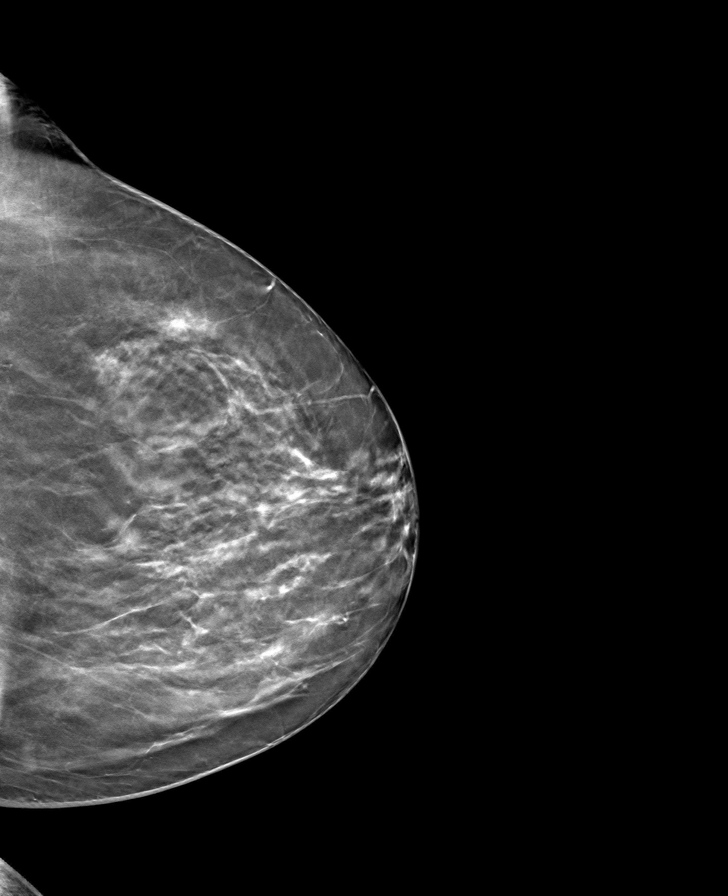

[R CC tomo · tomo slice 38/75.0]
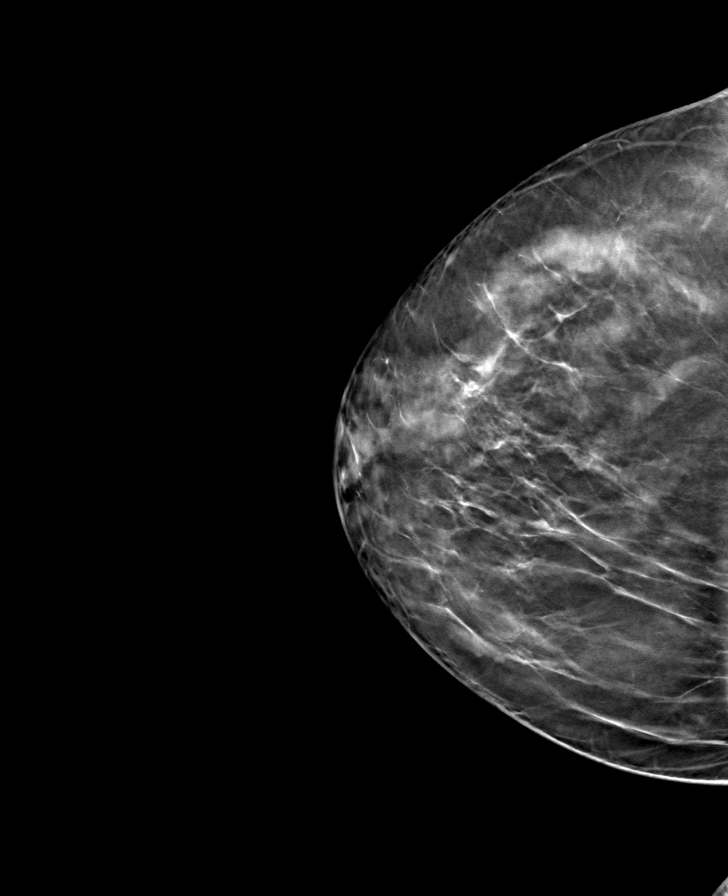

[L MLO tomo · tomo slice 38/75.0]
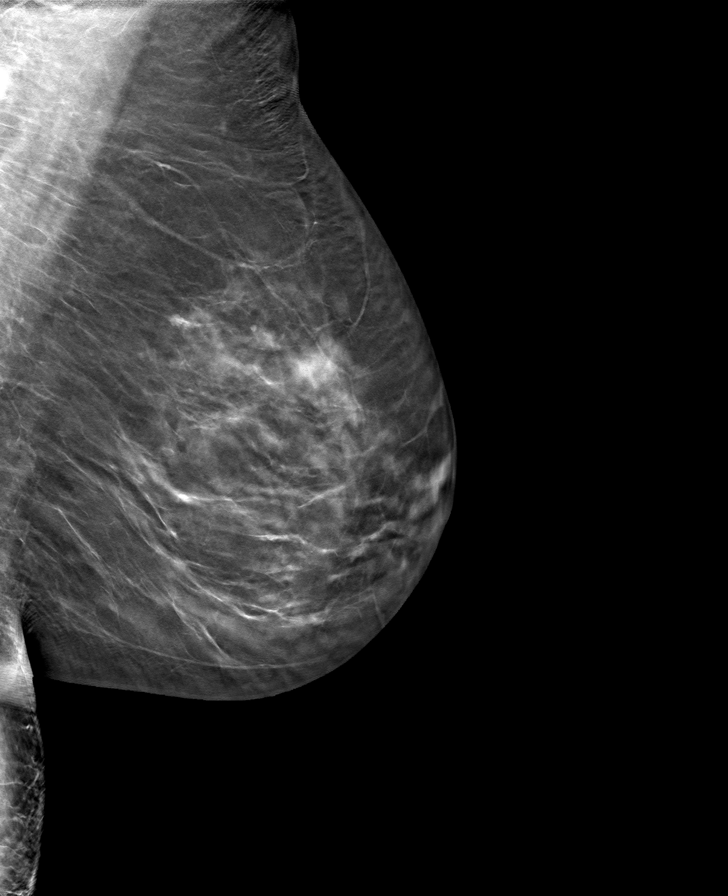

[8 of 24 positions shown; findings below may reference images not displayed]

ACR Breast Density Category c: The breast tissue is heterogeneously
dense, which may obscure small masses.
FINDINGS: There are no findings suspicious for malignancy.
IMPRESSION: No mammographic evidence of malignancy. A result letter of this
screening mammogram will be mailed directly to the patient.

RECOMMENDATION:
Screening mammogram in one year. (Code:[id number])

BI-RADS CATEGORY  1: Negative.

## 2022-02-19 NOTE — Progress Notes (Unsigned)
   Established Patient Office Visit  Subjective   Patient ID: [name], female   DOB: Oct 28, 1945 Age: 76 y.o. MRN: [id number]   No chief complaint on file.   HPI Pleasant 76 year old female presenting today for the following:  Hypothyroidism:  Prediabetes:   Objective:    There were no vitals filed for this visit.  Physical Exam   No results found for this or any previous visit (from the past 24 hour(s)).   {Labs (Optional):23779}  The 10-year ASCVD risk score (Arnett DK, et al., 2019) is: 17%   Values used to calculate the score:     Age: 14 years     Sex: Female     Is Non-Hispanic African American: No     Diabetic: No     Tobacco smoker: No     Systolic Blood Pressure: 072 mmHg     Is BP treated: No     HDL Cholesterol: 61 mg/dL     Total Cholesterol: 205 mg/dL   Assessment & Plan:   No problem-specific Assessment & Plan notes found for this encounter.   No follow-ups on file.  ___________________________________________ Clearnce Sorrel, DNP, APRN, FNP-BC [facility]

## 2022-02-20 ENCOUNTER — Ambulatory Visit (INDEPENDENT_AMBULATORY_CARE_PROVIDER_SITE_OTHER): Payer: Medicare Other | Admitting: Medical-Surgical

## 2022-02-20 ENCOUNTER — Encounter: Payer: Self-pay | Admitting: Medical-Surgical

## 2022-02-20 VITALS — BP 136/86 | HR 74 | Resp 20 | Ht 67.0 in | Wt 178.0 lb

## 2022-02-20 DIAGNOSIS — E039 Hypothyroidism, unspecified: Secondary | ICD-10-CM | POA: Diagnosis not present

## 2022-02-20 DIAGNOSIS — R7303 Prediabetes: Secondary | ICD-10-CM | POA: Diagnosis not present

## 2022-02-20 DIAGNOSIS — Z0000 Encounter for general adult medical examination without abnormal findings: Secondary | ICD-10-CM

## 2022-02-21 LAB — LIPID PANEL
Cholesterol: 226 mg/dL — ABNORMAL HIGH (ref <200)
HDL: 65 mg/dL (ref >=50)
LDL Cholesterol (Calc): 131 mg/dL (calc) — ABNORMAL HIGH
Non-HDL Cholesterol (Calc): 161 mg/dL (calc) — ABNORMAL HIGH (ref <130)
Total CHOL/HDL Ratio: 3.5 (calc) (ref <5.0)
Triglycerides: 163 mg/dL — ABNORMAL HIGH (ref <150)

## 2022-02-21 LAB — COMPLETE METABOLIC PANEL WITH GFR
AG Ratio: 1.7 (calc) (ref 1.0–2.5)
ALT: 18 U/L (ref 6–29)
AST: 19 U/L (ref 10–35)
Albumin: 4.3 g/dL (ref 3.6–5.1)
Alkaline phosphatase (APISO): 99 U/L (ref 37–153)
BUN: 11 mg/dL (ref 7–25)
CO2: 28 mmol/L (ref 20–32)
Calcium: 9 mg/dL (ref 8.6–10.4)
Chloride: 101 mmol/L (ref 98–110)
Creat: 0.65 mg/dL (ref 0.60–1.00)
Globulin: 2.5 g/dL (calc) (ref 1.9–3.7)
Glucose, Bld: 88 mg/dL (ref 65–99)
Potassium: 4.3 mmol/L (ref 3.5–5.3)
Sodium: 138 mmol/L (ref 135–146)
Total Bilirubin: 0.6 mg/dL (ref 0.2–1.2)
Total Protein: 6.8 g/dL (ref 6.1–8.1)
eGFR: 92 mL/min/{1.73_m2} (ref >=60)

## 2022-02-21 LAB — CBC WITH DIFFERENTIAL/PLATELET
Absolute Monocytes: 391 cells/uL (ref 200–950)
Basophils Absolute: 50 cells/uL (ref 0–200)
Basophils Relative: 0.7 %
Eosinophils Absolute: 227 cells/uL (ref 15–500)
Eosinophils Relative: 3.2 %
HCT: 37.7 % (ref 35.0–45.0)
Hemoglobin: 12.7 g/dL (ref 11.7–15.5)
Lymphs Abs: 2286 cells/uL (ref 850–3900)
MCH: 31.3 pg (ref 27.0–33.0)
MCHC: 33.7 g/dL (ref 32.0–36.0)
MCV: 92.9 fL (ref 80.0–100.0)
MPV: 11.1 fL (ref 7.5–12.5)
Monocytes Relative: 5.5 %
Neutro Abs: 4146 cells/uL (ref 1500–7800)
Neutrophils Relative %: 58.4 %
Platelets: 259 10*3/uL (ref 140–400)
RBC: 4.06 10*6/uL (ref 3.80–5.10)
RDW: 12.1 % (ref 11.0–15.0)
Total Lymphocyte: 32.2 %
WBC: 7.1 10*3/uL (ref 3.8–10.8)

## 2022-02-21 LAB — TSH: TSH: 2.58 mIU/L (ref 0.40–4.50)

## 2022-02-21 LAB — HEMOGLOBIN A1C
Hgb A1c MFr Bld: 6.2 % of total Hgb — ABNORMAL HIGH (ref <5.7)
Mean Plasma Glucose: 131 mg/dL
eAG (mmol/L): 7.3 mmol/L

## 2022-02-27 ENCOUNTER — Ambulatory Visit (INDEPENDENT_AMBULATORY_CARE_PROVIDER_SITE_OTHER): Payer: Medicare Other | Admitting: Medical-Surgical

## 2022-02-27 DIAGNOSIS — Z0000 Encounter for general adult medical examination without abnormal findings: Secondary | ICD-10-CM | POA: Diagnosis not present

## 2022-02-27 NOTE — Patient Instructions (Addendum)
[place] Maintenance Summary and Written Plan of Care  [name] ,  Thank you for allowing me to perform your Medicare Annual Wellness Visit and for your ongoing commitment to your health.   Health Maintenance & Immunization History Health Maintenance  Topic Date Due   MAMMOGRAM  11/29/2022   Medicare Annual Wellness (AWV)  02/28/2023   Fecal DNA (Cologuard)  03/19/2023   DTaP/Tdap/Td (2 - Td or Tdap) 11/01/2024   Pneumonia Vaccine 58+ Years old  Completed   DEXA SCAN  Completed   Hepatitis C Screening  Completed   Zoster Vaccines- Shingrix  Completed   HPV VACCINES  Aged Out   INFLUENZA VACCINE  Discontinued   COVID-19 Vaccine  Discontinued   Immunization History  Administered Date(s) Administered   Influenza, High Dose Seasonal PF 11/08/2018   Influenza,inj,Quad PF,6+ Mos 12/29/2014, 01/05/2016   Influenza-Unspecified 12/11/2012, 11/08/2017, 11/09/2018, 11/11/2020   PFIZER(Purple Top)SARS-COV-2 Vaccination 05/04/2019, 05/28/2019   Pneumococcal Conjugate-13 11/02/2014   Pneumococcal Polysaccharide-23 10/10/2012   Tdap 11/02/2014   Zoster Recombinat (Shingrix) 02/20/2018, 04/21/2018   Zoster, Live 03/14/2007    These are the patient goals that we discussed:  Goals Addressed               This Visit's Progress     Patient Stated (pt-stated)        Patient stated that she would like to loose 5lbs.         This is a list of Health Maintenance Items that are overdue or due now: There are no preventive care reminders to display for this patient.    Orders/Referrals Placed Today: No orders of the defined types were placed in this encounter.  (Contact our referral department at [phone] if you have not spoken with someone about your referral appointment within the next 5 days)    Follow-up Plan Follow-up with Samuel Bouche, NP as planned Medicare wellness visit in one year. Patient will access AVS on my chart.      Health  Maintenance, Female Adopting a healthy lifestyle and getting preventive care are important in promoting health and wellness. Ask your health care provider about: The right schedule for you to have regular tests and exams. Things you can do on your own to prevent diseases and keep yourself healthy. What should I know about diet, weight, and exercise? Eat a healthy diet  Eat a diet that includes plenty of vegetables, fruits, low-fat dairy products, and lean protein. Do not eat a lot of foods that are high in solid fats, added sugars, or sodium. Maintain a healthy weight Body mass index (BMI) is used to identify weight problems. It estimates body fat based on height and weight. Your health care provider can help determine your BMI and help you achieve or maintain a healthy weight. Get regular exercise Get regular exercise. This is one of the most important things you can do for your health. Most adults should: Exercise for at least 150 minutes each week. The exercise should increase your heart rate and make you sweat (moderate-intensity exercise). Do strengthening exercises at least twice a week. This is in addition to the moderate-intensity exercise. Spend less time sitting. Even light physical activity can be beneficial. Watch cholesterol and blood lipids Have your blood tested for lipids and cholesterol at 75 years of age, then have this test every 5 years. Have your cholesterol levels checked more often if: Your lipid or cholesterol levels are high. You are older than 75  years of age. You are at high risk for heart disease. What should I know about cancer screening? Depending on your health history and family history, you may need to have cancer screening at various ages. This may include screening for: Breast cancer. Cervical cancer. Colorectal cancer. Skin cancer. Lung cancer. What should I know about heart disease, diabetes, and high blood pressure? Blood pressure and heart  disease High blood pressure causes heart disease and increases the risk of stroke. This is more likely to develop in people who have high blood pressure readings or are overweight. Have your blood pressure checked: Every 3-5 years if you are 68-49 years of age. Every year if you are 48 years old or older. Diabetes Have regular diabetes screenings. This checks your fasting blood sugar level. Have the screening done: Once every three years after age 64 if you are at a normal weight and have a low risk for diabetes. More often and at a younger age if you are overweight or have a high risk for diabetes. What should I know about preventing infection? Hepatitis B If you have a higher risk for hepatitis B, you should be screened for this virus. Talk with your health care provider to find out if you are at risk for hepatitis B infection. Hepatitis C Testing is recommended for: Everyone born from [birth year] through 1965. Anyone with known risk factors for hepatitis C. Sexually transmitted infections (STIs) Get screened for STIs, including gonorrhea and chlamydia, if: You are sexually active and are younger than 75 years of age. You are older than 76 years of age and your health care provider tells you that you are at risk for this type of infection. Your sexual activity has changed since you were last screened, and you are at increased risk for chlamydia or gonorrhea. Ask your health care provider if you are at risk. Ask your health care provider about whether you are at high risk for HIV. Your health care provider may recommend a prescription medicine to help prevent HIV infection. If you choose to take medicine to prevent HIV, you should first get tested for HIV. You should then be tested every 3 months for as long as you are taking the medicine. Pregnancy If you are about to stop having your period (premenopausal) and you may become pregnant, seek counseling before you get pregnant. Take 400 to 800  micrograms (mcg) of folic acid every day if you become pregnant. Ask for birth control (contraception) if you want to prevent pregnancy. Osteoporosis and menopause Osteoporosis is a disease in which the bones lose minerals and strength with aging. This can result in bone fractures. If you are 53 years old or older, or if you are at risk for osteoporosis and fractures, ask your health care provider if you should: Be screened for bone loss. Take a calcium or vitamin D supplement to lower your risk of fractures. Be given hormone replacement therapy (HRT) to treat symptoms of menopause. Follow these instructions at home: Alcohol use Do not drink alcohol if: Your health care provider tells you not to drink. You are pregnant, may be pregnant, or are planning to become pregnant. If you drink alcohol: Limit how much you have to: 0-1 drink a day. Know how much alcohol is in your drink. In the [place] one drink equals one 12 oz bottle of beer (355 mL), one 5 oz glass of wine (148 mL), or one 1 oz glass of hard liquor (44 mL). Lifestyle Do not use  any products that contain nicotine or tobacco. These products include cigarettes, chewing tobacco, and vaping devices, such as e-cigarettes. If you need help quitting, ask your health care provider. Do not use street drugs. Do not share needles. Ask your health care provider for help if you need support or information about quitting drugs. General instructions Schedule regular health, dental, and eye exams. Stay current with your vaccines. Tell your health care provider if: You often feel depressed. You have ever been abused or do not feel safe at home. Summary Adopting a healthy lifestyle and getting preventive care are important in promoting health and wellness. Follow your health care provider's instructions about healthy diet, exercising, and getting tested or screened for diseases. Follow your health care provider's instructions on monitoring your  cholesterol and blood pressure. This information is not intended to replace advice given to you by your health care provider. Make sure you discuss any questions you have with your health care provider. Document Revised: 07/19/2020 Document Reviewed: 07/19/2020 Elsevier Patient Education  [place].

## 2022-02-27 NOTE — Progress Notes (Signed)
MEDICARE ANNUAL WELLNESS VISIT  02/27/2022  Telephone Visit Disclaimer This Medicare AWV was conducted by telephone due to national recommendations for restrictions regarding the COVID-19 Pandemic (e.g. social distancing).  I verified, using two identifiers, that I am speaking with [name] or their authorized healthcare agent. I discussed the limitations, risks, security, and privacy concerns of performing an evaluation and management service by telephone and the potential availability of an in-person appointment in the future. The patient expressed understanding and agreed to proceed.  Location of Patient: Home Location of Provider (nurse):  In the office.  Subjective:    [name] is a 76 y.o. female patient of Samuel Bouche, NP who had a Medicare Annual Wellness Visit today via telephone. [name] is Working full time and lives alone. she does not have any children. she reports that she is socially active and does interact with friends/family regularly. she is moderately physically active and enjoys reading.  Patient Care Team: Samuel Bouche, NP as PCP - General (Nurse Practitioner)     02/27/2022   10:01 AM 02/23/2021   10:00 AM  Advanced Directives  Does Patient Have a Medical Advance Directive? Yes No  Type of Advance Directive Living will   Does patient want to make changes to medical advance directive? No - Patient declined   Would patient like information on creating a medical advance directive?  No - Patient declined    Hospital Utilization Over the Past 12 Months: # of hospitalizations or ER visits: 0 # of surgeries: 0  Review of Systems    Patient reports that her overall health is better compared to last year.  History obtained from chart review and the patient  Patient Reported Readings (BP, Pulse, CBG, Weight, etc) none  Pain Assessment Pain : No/denies pain     Current Medications & Allergies (verified) Allergies as of 02/27/2022       Reactions    Penicillins Other (See Comments)   "I get yeast infections with penicillins"         Medication List        Accurate as of February 27, 2022 10:10 AM. If you have any questions, ask your nurse or doctor.          aspirin EC 81 MG tablet Take 81 mg by mouth daily.   B-12 2500 MCG Tabs Take 2 tablets daily by mouth.   CALCIUM 1200 PO Take 2 tablets by mouth daily.   cholecalciferol 1000 units tablet Commonly known as: VITAMIN D Take 1,000 Units by mouth daily.   citalopram 40 MG tablet Commonly known as: CELEXA TAKE 1 TABLET EVERY DAY   estradiol 1 MG tablet Commonly known as: ESTRACE TAKE 1 TABLET EVERY DAY   famotidine 20 MG tablet Commonly known as: PEPCID TAKE 1 TABLET TWICE DAILY (NO REFILLS. NEED TO TRANSITION CARE TO NEW PCP)   fish oil-omega-3 fatty acids 1000 MG capsule Take 1 capsule by mouth daily.   fluticasone 50 MCG/ACT nasal spray Commonly known as: FLONASE USE 1 SPRAY INTO BOTH NOSTRILS DAILY.   GARLIC PO Take 1 capsule by mouth.   Ginkgo Biloba 60 MG Tabs Take by mouth.   HAIR/SKIN/NAILS PO Take by mouth daily.   levothyroxine 50 MCG tablet Commonly known as: SYNTHROID TAKE 1 TABLET EVERY DAY (NEED APPOINTMENT AND LABS)   multivitamin tablet Take 1 tablet by mouth daily.   Turmeric 500 MG Tabs Take by mouth.        History (  reviewed): Past Medical History:  Diagnosis Date   BCC (basal cell carcinoma of skin) 03/27/2017   Left Lower Leg Behind Knee (tx p bx)   Depression    Hyperlipidemia    Menopause    Nodular basal cell carcinoma (BCC) 01/18/2017   Lower Left Leg (Dr. Sheppard Coil)   Thyroid disease    hypothyroid   Past Surgical History:  Procedure Laterality Date   ABDOMINAL HYSTERECTOMY     Family History  Problem Relation Age of Onset   Heart disease Mother    Diabetes Mother    Hyperlipidemia Mother    Cancer Mother        kidney   Heart attack Mother    Heart disease Father    Social History    Socioeconomic History   Marital status: Divorced    Spouse name: Not on file   Number of children: Not on file   Years of education: 14   Highest education level: Some college, no degree  Occupational History   Occupation: Cabin crew    Comment: Full time  Tobacco Use   Smoking status: Never   Smokeless tobacco: Never  Vaping Use   Vaping Use: Never used  Substance and Sexual Activity   Alcohol use: No    Alcohol/week: 1.0 standard drink of alcohol    Types: 1 Standard drinks or equivalent per week   Drug use: No   Sexual activity: Not Currently  Other Topics Concern   Not on file  Social History Narrative   Lives alone. Still working full time. She enjoys reading.    Social Determinants of Health   Financial Resource Strain: Low Risk  (02/27/2022)   Overall Financial Resource Strain (CARDIA)    Difficulty of Paying Living Expenses: Not hard at all  Food Insecurity: No Food Insecurity (02/27/2022)   Hunger Vital Sign    Worried About Running Out of Food in the Last Year: Never true    Ran Out of Food in the Last Year: Never true  Transportation Needs: No Transportation Needs (02/27/2022)   PRAPARE - Hydrologist (Medical): No    Lack of Transportation (Non-Medical): No  Physical Activity: Sufficiently Active (02/27/2022)   Exercise Vital Sign    Days of Exercise per Week: 7 days    Minutes of Exercise per Session: 60 min  Stress: No Stress Concern Present (02/27/2022)   [place]    Feeling of Stress : Not at all  Social Connections: Moderately Isolated (02/27/2022)   Social Connection and Isolation Panel [NHANES]    Frequency of Communication with Friends and Family: More than three times a week    Frequency of Social Gatherings with Friends and Family: More than three times a week    Attends Religious Services: More than 4 times per year    Active Member of [employer] or  Organizations: No    Attends Archivist Meetings: Never    Marital Status: Divorced    Activities of Daily Living    02/27/2022   10:02 AM  In your present state of health, do you have any difficulty performing the following activities:  Hearing? 0  Vision? 0  Difficulty concentrating or making decisions? 0  Walking or climbing stairs? 0  Dressing or bathing? 0  Doing errands, shopping? 0  Preparing Food and eating ? N  Using the Toilet? N  In the past six months, have you accidently leaked  urine? N  Do you have problems with loss of bowel control? N  Managing your Medications? N  Managing your Finances? N  Housekeeping or managing your Housekeeping? N    Patient Education/ Literacy How often do you need to have someone help you when you read instructions, pamphlets, or other written materials from your doctor or pharmacy?: 1 - Never What is the last grade level you completed in school?: Associates degree and technical school  Exercise Current Exercise Habits: Home exercise routine, Type of exercise: strength training/weights;walking, Time (Minutes): 60, Frequency (Times/Week): 7, Weekly Exercise (Minutes/Week): 420, Intensity: Moderate, Exercise limited by: None identified  Diet Patient reports consuming 3 meals a day and 1 snack(s) a day Patient reports that her primary diet is: Regular Patient reports that she does have regular access to food.   Depression Screen    02/27/2022   10:02 AM 08/19/2021    2:58 PM 02/23/2021   10:04 AM 02/18/2021    2:07 PM 02/17/2019    2:14 PM 01/15/2017    1:55 PM 10/15/2013    2:20 PM  PHQ 2/9 Scores  PHQ - 2 Score 0 0 0 0 0 0 3  PHQ- 9 Score     0  3     Fall Risk    02/27/2022   10:01 AM 08/19/2021    2:57 PM 02/23/2021   10:04 AM 02/18/2021    2:06 PM 02/04/2019    4:57 PM  [place] in the past year? 0 0 0 0 0  Comment     Emmi Telephone Survey: data to providers prior to load  Number falls in past yr: 0 0  0 0   Injury with Fall? 0 0 0 0   Risk for fall due to : No Fall Risks No Fall Risks No Fall Risks No Fall Risks   Follow up Falls evaluation completed Falls evaluation completed Falls evaluation completed Falls evaluation completed      Objective:  [name] seemed alert and oriented and she participated appropriately during our telephone visit.  Blood Pressure Weight BMI  BP Readings from Last 3 Encounters:  02/20/22 136/86  08/19/21 132/88  02/18/21 130/87   Wt Readings from Last 3 Encounters:  02/20/22 178 lb (80.7 kg)  08/19/21 185 lb 6.4 oz (84.1 kg)  02/18/21 178 lb (80.7 kg)   BMI Readings from Last 1 Encounters:  02/20/22 27.88 kg/m    *Unable to obtain current vital signs, weight, and BMI due to telephone visit type  Hearing/Vision  Stanton Kidney did not seem to have difficulty with hearing/understanding during the telephone conversation Reports that she has had a formal eye exam by an eye care professional within the past year Reports that she has not had a formal hearing evaluation within the past year *Unable to fully assess hearing and vision during telephone visit type  Cognitive Function:    02/27/2022   10:06 AM 02/23/2021   10:08 AM  6CIT Screen  What Year? 0 points 0 points  What month? 0 points 0 points  What time? 0 points 0 points  Count back from 20 0 points 0 points  Months in reverse 0 points 0 points  Repeat phrase 0 points 0 points  Total Score 0 points 0 points   (Normal:0-7, Significant for Dysfunction: >8)  Normal Cognitive Function Screening: Yes   Immunization & Health Maintenance Record Immunization History  Administered Date(s) Administered   Influenza, High Dose Seasonal PF  11/08/2018   Influenza,inj,Quad PF,6+ Mos 12/29/2014, 01/05/2016   Influenza-Unspecified 12/11/2012, 11/08/2017, 11/09/2018, 11/11/2020   PFIZER(Purple Top)SARS-COV-2 Vaccination 05/04/2019, 05/28/2019   Pneumococcal Conjugate-13 11/02/2014   Pneumococcal  Polysaccharide-23 10/10/2012   Tdap 11/02/2014   Zoster Recombinat (Shingrix) 02/20/2018, 04/21/2018   Zoster, Live 03/14/2007    Health Maintenance  Topic Date Due   MAMMOGRAM  11/29/2022   Medicare Annual Wellness (AWV)  02/28/2023   Fecal DNA (Cologuard)  03/19/2023   DTaP/Tdap/Td (2 - Td or Tdap) 11/01/2024   Pneumonia Vaccine 8+ Years old  Completed   DEXA SCAN  Completed   Hepatitis C Screening  Completed   Zoster Vaccines- Shingrix  Completed   HPV VACCINES  Aged Out   INFLUENZA VACCINE  Discontinued   COVID-19 Vaccine  Discontinued       Assessment  This is a routine wellness examination for [place] Maintenance: Due or Overdue There are no preventive care reminders to display for this patient.   [name] does not need a referral for Community Assistance: Care Management:   no Social Work:    no Prescription Assistance:  no Nutrition/Diabetes Education:  no   Plan:  Personalized Goals  Goals Addressed               This Visit's Progress     Patient Stated (pt-stated)        Patient stated that she would like to loose 5lbs.       Personalized Health Maintenance & Screening Recommendations  There are no preventive care reminders to display for this patient.  Lung Cancer Screening Recommended: no (Low Dose CT Chest recommended if Age 42-80 years, 30 pack-year currently smoking OR have quit w/in past 15 years) Hepatitis C Screening recommended: no HIV Screening recommended: no  Advanced Directives: Written information was not prepared per patient's request.  Referrals & Orders No orders of the defined types were placed in this encounter.   Follow-up Plan Follow-up with Samuel Bouche, NP as planned Medicare wellness visit in one year. Patient will access AVS on my chart.   I have personally reviewed and noted the following in the patient's chart:   Medical and social history Use of alcohol, tobacco or illicit drugs  Current  medications and supplements Functional ability and status Nutritional status Physical activity Advanced directives List of other physicians Hospitalizations, surgeries, and ER visits in previous 12 months Vitals Screenings to include cognitive, depression, and falls Referrals and appointments  In addition, I have reviewed and discussed with [name] certain preventive protocols, quality metrics, and best practice recommendations. A written personalized care plan for preventive services as well as general preventive health recommendations is available and can be mailed to the patient at her request.      Tinnie Gens, RN BSN  02/27/2022

## 2022-03-15 ENCOUNTER — Ambulatory Visit (INDEPENDENT_AMBULATORY_CARE_PROVIDER_SITE_OTHER): Payer: Medicare Other

## 2022-03-15 DIAGNOSIS — M8580 Other specified disorders of bone density and structure, unspecified site: Secondary | ICD-10-CM

## 2022-03-15 DIAGNOSIS — Z780 Asymptomatic menopausal state: Secondary | ICD-10-CM

## 2022-03-15 DIAGNOSIS — M8589 Other specified disorders of bone density and structure, multiple sites: Secondary | ICD-10-CM | POA: Diagnosis not present

## 2022-03-22 ENCOUNTER — Other Ambulatory Visit: Payer: Self-pay | Admitting: Medical-Surgical

## 2022-03-22 DIAGNOSIS — Z780 Asymptomatic menopausal state: Secondary | ICD-10-CM

## 2022-03-22 DIAGNOSIS — M8580 Other specified disorders of bone density and structure, unspecified site: Secondary | ICD-10-CM

## 2022-03-22 DIAGNOSIS — E039 Hypothyroidism, unspecified: Secondary | ICD-10-CM

## 2022-04-28 ENCOUNTER — Encounter: Payer: Self-pay | Admitting: Medical-Surgical

## 2022-05-10 DIAGNOSIS — B078 Other viral warts: Secondary | ICD-10-CM | POA: Diagnosis not present

## 2022-05-10 DIAGNOSIS — D225 Melanocytic nevi of trunk: Secondary | ICD-10-CM | POA: Diagnosis not present

## 2022-05-10 DIAGNOSIS — L579 Skin changes due to chronic exposure to nonionizing radiation, unspecified: Secondary | ICD-10-CM | POA: Diagnosis not present

## 2022-05-10 DIAGNOSIS — D235 Other benign neoplasm of skin of trunk: Secondary | ICD-10-CM | POA: Diagnosis not present

## 2022-05-10 DIAGNOSIS — L821 Other seborrheic keratosis: Secondary | ICD-10-CM | POA: Diagnosis not present

## 2022-05-10 DIAGNOSIS — L814 Other melanin hyperpigmentation: Secondary | ICD-10-CM | POA: Diagnosis not present

## 2022-05-10 DIAGNOSIS — Z85828 Personal history of other malignant neoplasm of skin: Secondary | ICD-10-CM | POA: Diagnosis not present

## 2022-05-10 DIAGNOSIS — D485 Neoplasm of uncertain behavior of skin: Secondary | ICD-10-CM | POA: Diagnosis not present

## 2022-05-20 ENCOUNTER — Other Ambulatory Visit: Payer: Self-pay | Admitting: Medical-Surgical

## 2022-07-15 ENCOUNTER — Other Ambulatory Visit: Payer: Self-pay | Admitting: Medical-Surgical

## 2022-07-15 DIAGNOSIS — Z780 Asymptomatic menopausal state: Secondary | ICD-10-CM

## 2022-10-15 ENCOUNTER — Other Ambulatory Visit: Payer: Self-pay | Admitting: Medical-Surgical

## 2022-10-23 ENCOUNTER — Other Ambulatory Visit: Payer: Self-pay | Admitting: Medical-Surgical

## 2022-10-23 ENCOUNTER — Telehealth: Payer: Self-pay | Admitting: Medical-Surgical

## 2022-10-23 DIAGNOSIS — Z1231 Encounter for screening mammogram for malignant neoplasm of breast: Secondary | ICD-10-CM

## 2022-10-23 NOTE — Telephone Encounter (Signed)
Patient called in for lab work before [employer], 03/05/23. Please advise

## 2022-10-23 NOTE — Telephone Encounter (Signed)
Called in for lab work hypothyroidism.

## 2022-11-25 ENCOUNTER — Encounter: Payer: Self-pay | Admitting: Medical-Surgical

## 2022-11-30 ENCOUNTER — Ambulatory Visit
Admission: RE | Admit: 2022-11-30 | Discharge: 2022-11-30 | Disposition: A | Discharge status: Home / Self Care | Payer: Medicare Other | Source: Ambulatory Visit | Attending: Medical-Surgical | Admitting: Medical-Surgical

## 2022-11-30 DIAGNOSIS — Z1231 Encounter for screening mammogram for malignant neoplasm of breast: Secondary | ICD-10-CM | POA: Diagnosis not present

## 2022-12-04 ENCOUNTER — Other Ambulatory Visit: Payer: Self-pay | Admitting: Medical-Surgical

## 2022-12-04 DIAGNOSIS — H1045 Other chronic allergic conjunctivitis: Secondary | ICD-10-CM | POA: Diagnosis not present

## 2022-12-04 DIAGNOSIS — H2513 Age-related nuclear cataract, bilateral: Secondary | ICD-10-CM | POA: Diagnosis not present

## 2022-12-04 DIAGNOSIS — R928 Other abnormal and inconclusive findings on diagnostic imaging of breast: Secondary | ICD-10-CM

## 2022-12-04 DIAGNOSIS — D3132 Benign neoplasm of left choroid: Secondary | ICD-10-CM | POA: Diagnosis not present

## 2022-12-11 ENCOUNTER — Other Ambulatory Visit: Payer: Self-pay | Admitting: Medical-Surgical

## 2022-12-11 DIAGNOSIS — Z780 Asymptomatic menopausal state: Secondary | ICD-10-CM

## 2022-12-12 ENCOUNTER — Ambulatory Visit
Admission: RE | Admit: 2022-12-12 | Discharge: 2022-12-12 | Disposition: A | Discharge status: Home / Self Care | Payer: Medicare Other | Source: Ambulatory Visit | Attending: Medical-Surgical

## 2022-12-12 DIAGNOSIS — N6002 Solitary cyst of left breast: Secondary | ICD-10-CM | POA: Diagnosis not present

## 2022-12-12 DIAGNOSIS — R928 Other abnormal and inconclusive findings on diagnostic imaging of breast: Secondary | ICD-10-CM

## 2022-12-29 ENCOUNTER — Other Ambulatory Visit: Payer: Self-pay | Admitting: Medical-Surgical

## 2023-01-10 ENCOUNTER — Other Ambulatory Visit: Payer: Self-pay | Admitting: Medical-Surgical

## 2023-01-10 DIAGNOSIS — M8580 Other specified disorders of bone density and structure, unspecified site: Secondary | ICD-10-CM

## 2023-01-10 DIAGNOSIS — E039 Hypothyroidism, unspecified: Secondary | ICD-10-CM

## 2023-01-10 DIAGNOSIS — Z780 Asymptomatic menopausal state: Secondary | ICD-10-CM

## 2023-02-23 ENCOUNTER — Other Ambulatory Visit: Payer: Self-pay | Admitting: Medical-Surgical

## 2023-02-23 ENCOUNTER — Ambulatory Visit (INDEPENDENT_AMBULATORY_CARE_PROVIDER_SITE_OTHER): Payer: Medicare Other | Admitting: Medical-Surgical

## 2023-02-23 VITALS — BP 124/75 | HR 85 | Resp 20 | Ht 67.0 in | Wt 183.0 lb

## 2023-02-23 DIAGNOSIS — E782 Mixed hyperlipidemia: Secondary | ICD-10-CM | POA: Diagnosis not present

## 2023-02-23 DIAGNOSIS — F419 Anxiety disorder, unspecified: Secondary | ICD-10-CM

## 2023-02-23 DIAGNOSIS — Z780 Asymptomatic menopausal state: Secondary | ICD-10-CM

## 2023-02-23 DIAGNOSIS — R7303 Prediabetes: Secondary | ICD-10-CM

## 2023-02-23 DIAGNOSIS — E039 Hypothyroidism, unspecified: Secondary | ICD-10-CM | POA: Diagnosis not present

## 2023-02-23 DIAGNOSIS — M8580 Other specified disorders of bone density and structure, unspecified site: Secondary | ICD-10-CM | POA: Diagnosis not present

## 2023-02-23 MED ORDER — CITALOPRAM HYDROBROMIDE 40 MG PO TABS
40.0000 mg | ORAL_TABLET | Freq: Every day | ORAL | 0 refills | Status: DC
Start: 2023-02-23 — End: 2023-05-07

## 2023-02-23 MED ORDER — FAMOTIDINE 20 MG PO TABS: 20.0000 mg | ORAL_TABLET | Freq: Two times a day (BID) | ORAL | 3 refills | Status: DC

## 2023-02-23 MED ORDER — FLUTICASONE PROPIONATE 50 MCG/ACT NA SUSP: NASAL | 0 refills | Status: DC

## 2023-02-23 MED ORDER — ESTRADIOL 1 MG PO TABS
1.0000 mg | ORAL_TABLET | Freq: Every day | ORAL | 0 refills | Status: DC
Start: 2023-02-23 — End: 2023-05-31

## 2023-02-23 NOTE — Progress Notes (Signed)
        Established patient visit  History, exam, impression, and plan:  1. Hypothyroidism, unspecified type (Primary) Very pleasant 77 year old female presenting today for follow-up on hypothyroidism.  She has been treated long-term with levothyroxine 50 mcg daily, tolerating well without side effects.  Compliant with dosing recommendations.  Has endorsed a little bit of increased fatigue but thinks this is because she has been very busy during the holiday season.  Denies any other concerning symptoms.  HRR, S1/S2 normal.  Rechecking TSH today.  Continue levothyroxine, possible dose change depending on results.  Of note, she is taking balance of nature multivitamin which likely has biotin in it.  If her TSH comes back abnormal, may recommend stopping the multivitamin and rechecking in 2 to 4 weeks. - TSH  2. Prediabetes At last check, her hemoglobin A1c was 6.2%.  Rechecking today. - Hemoglobin A1c  3. Mixed hyperlipidemia Reports that she went to a community resource where they checked her lipids and they had improved.  Rechecking today. - CMP14+EGFR - Lipid panel  4. Post-menopausal Has been taking estradiol 1 mg daily and has no current concerns or side effects.  Checking CBC today.  Discussed possibly reducing her dose of estradiol to the lowest effective dose with a goal of stopping the medication due to risks.  She is agreeable to trying to do a slow taper with every other day dosing of 1 mg and 0.5 mg for 2 to 3 weeks and if doing okay then reduce to 0.5 mg daily. - CBC with Differential/Platelet - estradiol (ESTRACE) 1 MG tablet; Take 1 tablet (1 mg total) by mouth daily.  Dispense: 90 tablet; Refill: 0  5. Osteopenia, unspecified location She does have osteopenia on imaging.  CBC and estradiol dosing as noted above. - CBC with Differential/Platelet - estradiol (ESTRACE) 1 MG tablet; Take 1 tablet (1 mg total) by mouth daily.  Dispense: 90 tablet; Refill: 0  6. Anxiety Taking  Celexa 40 mg daily, tolerating well without side effects.  Has been on this long-term and feels the medication is still working well.  Denies SI/HI.  Continue Celexa as prescribed.   Procedures performed this visit: None.  Return in about 6 months (around 08/24/2023) for Hypothyroidism/prediabetes f/up.  __________________________________ Thayer Ohm, DNP, APRN, FNP-BC Primary Care and Sports Medicine [facility] [place]

## 2023-02-24 LAB — CMP14+EGFR
ALT: 17 [IU]/L (ref 0–32)
AST: 19 [IU]/L (ref 0–40)
Albumin: 4.4 g/dL (ref 3.8–4.8)
Alkaline Phosphatase: 132 [IU]/L — ABNORMAL HIGH (ref 44–121)
BUN/Creatinine Ratio: 19 (ref 12–28)
BUN: 13 mg/dL (ref 8–27)
Bilirubin Total: 0.6 mg/dL (ref 0.0–1.2)
CO2: 23 mmol/L (ref 20–29)
Calcium: 9.2 mg/dL (ref 8.7–10.3)
Chloride: 98 mmol/L (ref 96–106)
Creatinine, Ser: 0.7 mg/dL (ref 0.57–1.00)
Globulin, Total: 2.4 g/dL (ref 1.5–4.5)
Glucose: 93 mg/dL (ref 70–99)
Potassium: 4.3 mmol/L (ref 3.5–5.2)
Sodium: 137 mmol/L (ref 134–144)
Total Protein: 6.8 g/dL (ref 6.0–8.5)
eGFR: 90 mL/min/{1.73_m2} (ref >59)

## 2023-02-24 LAB — LIPID PANEL
Chol/HDL Ratio: 3.8 {ratio} (ref 0.0–4.4)
Cholesterol, Total: 229 mg/dL — ABNORMAL HIGH (ref 100–199)
HDL: 60 mg/dL (ref >39)
LDL Chol Calc (NIH): 149 mg/dL — ABNORMAL HIGH (ref 0–99)
Triglycerides: 111 mg/dL (ref 0–149)
VLDL Cholesterol Cal: 20 mg/dL (ref 5–40)

## 2023-02-24 LAB — CBC WITH DIFFERENTIAL/PLATELET
Basophils Absolute: 0.1 10*3/uL (ref 0.0–0.2)
Basos: 1 %
EOS (ABSOLUTE): 0.2 10*3/uL (ref 0.0–0.4)
Eos: 4 %
Hematocrit: 38.5 % (ref 34.0–46.6)
Hemoglobin: 12.4 g/dL (ref 11.1–15.9)
Immature Grans (Abs): 0 10*3/uL (ref 0.0–0.1)
Immature Granulocytes: 0 %
Lymphocytes Absolute: 2.1 10*3/uL (ref 0.7–3.1)
Lymphs: 31 %
MCH: 29.8 pg (ref 26.6–33.0)
MCHC: 32.2 g/dL (ref 31.5–35.7)
MCV: 93 fL (ref 79–97)
Monocytes Absolute: 0.4 10*3/uL (ref 0.1–0.9)
Monocytes: 6 %
Neutrophils Absolute: 4 10*3/uL (ref 1.4–7.0)
Neutrophils: 58 %
Platelets: 304 10*3/uL (ref 150–450)
RBC: 4.16 x10E6/uL (ref 3.77–5.28)
RDW: 12.7 % (ref 11.7–15.4)
WBC: 6.9 10*3/uL (ref 3.4–10.8)

## 2023-02-24 LAB — TSH: TSH: 3.86 u[IU]/mL (ref 0.450–4.500)

## 2023-02-24 LAB — HEMOGLOBIN A1C
Est. average glucose Bld gHb Est-mCnc: 140 mg/dL
Hgb A1c MFr Bld: 6.5 % — ABNORMAL HIGH (ref 4.8–5.6)

## 2023-02-26 ENCOUNTER — Other Ambulatory Visit: Payer: Self-pay | Admitting: Medical-Surgical

## 2023-02-26 DIAGNOSIS — E039 Hypothyroidism, unspecified: Secondary | ICD-10-CM

## 2023-02-26 MED ORDER — LEVOTHYROXINE SODIUM 50 MCG PO TABS: ORAL_TABLET | ORAL | 3 refills | Status: DC

## 2023-02-27 ENCOUNTER — Encounter: Payer: Self-pay | Admitting: Medical-Surgical

## 2023-02-27 DIAGNOSIS — E119 Type 2 diabetes mellitus without complications: Secondary | ICD-10-CM

## 2023-03-05 ENCOUNTER — Ambulatory Visit (INDEPENDENT_AMBULATORY_CARE_PROVIDER_SITE_OTHER): Payer: Medicare Other | Admitting: Family Medicine

## 2023-03-05 ENCOUNTER — Encounter: Payer: Self-pay | Admitting: Family Medicine

## 2023-03-05 VITALS — Ht 67.0 in | Wt 177.0 lb

## 2023-03-05 DIAGNOSIS — Z0000 Encounter for general adult medical examination without abnormal findings: Secondary | ICD-10-CM

## 2023-03-05 NOTE — Progress Notes (Signed)
Subjective:   [name] is a 77 y.o. female who presents for Medicare Annual (Subsequent) preventive examination.  Visit Complete: Virtual I connected with  Ulyess Mort on 03/05/23 by a audio enabled telemedicine application and verified that I am speaking with the correct person using two identifiers.  Patient Location: Home  Provider Location: Office/Clinic  I discussed the limitations of evaluation and management by telemedicine. The patient expressed understanding and agreed to proceed.  Vital Signs: Because this visit was a virtual/telehealth visit, some criteria may be missing or patient reported. Any vitals not documented were not able to be obtained and vitals that have been documented are patient reported.  Patient Medicare AWV questionnaire was completed by the patient on n/a ; I have confirmed that all information answered by patient is correct and no changes since this date.  Cardiac Risk Factors include: advanced age (>73men, >18 women);diabetes mellitus;dyslipidemia     Objective:    Today's Vitals   03/05/23 0951  Weight: 177 lb (80.3 kg)  Height: 5\' 7"  (1.702 m)   Body mass index is 27.72 kg/m.     03/05/2023    9:59 AM 02/27/2022   10:01 AM 02/23/2021   10:00 AM  Advanced Directives  Does Patient Have a Medical Advance Directive? Yes;No Yes No  Type of Advance Directive  Living will   Does patient want to make changes to medical advance directive? No - Patient declined No - Patient declined   Would patient like information on creating a medical advance directive?   No - Patient declined    Current Medications (verified) Outpatient Encounter Medications as of 03/05/2023  Medication Sig   aspirin 81 MG EC tablet Take 81 mg by mouth daily.     Calcium Carbonate-Vit D-Min (CALCIUM 1200 PO) Take 2 tablets by mouth daily.    cholecalciferol (VITAMIN D) 1000 UNITS tablet Take 1,000 Units by mouth daily.     citalopram (CELEXA) 40 MG tablet Take 1 tablet  (40 mg total) by mouth daily.   Cyanocobalamin (B-12) 2500 MCG TABS Take 2 tablets daily by mouth.   estradiol (ESTRACE) 1 MG tablet Take 1 tablet (1 mg total) by mouth daily.   famotidine (PEPCID) 20 MG tablet Take 1 tablet (20 mg total) by mouth 2 (two) times daily.   fish oil-omega-3 fatty acids 1000 MG capsule Take 1 capsule by mouth daily.   fluticasone (FLONASE) 50 MCG/ACT nasal spray USE 1 SPRAY IN EACH NOSTRIL EVERY DAY   GARLIC PO Take 1 capsule by mouth.   Ginkgo Biloba 60 MG TABS Take by mouth.   levothyroxine (SYNTHROID) 50 MCG tablet TAKE 1 TABLET EVERY DAY BEFORE BREAKFAST   Multiple Vitamin (MULTIVITAMIN) tablet Take 1 tablet by mouth daily.   Multiple Vitamins-Minerals (HAIR/SKIN/NAILS PO) Take by mouth daily.   Turmeric 500 MG TABS Take by mouth.   No facility-administered encounter medications on file as of 03/05/2023.    Allergies (verified) Penicillins   History: Past Medical History:  Diagnosis Date   BCC (basal cell carcinoma of skin) 03/27/2017   Left Lower Leg Behind Knee (tx p bx)   Depression    Hyperlipidemia    Menopause    Nodular basal cell carcinoma (BCC) 01/18/2017   Lower Left Leg (Dr. Lyn Hollingshead)   Thyroid disease    hypothyroid   Past Surgical History:  Procedure Laterality Date   ABDOMINAL HYSTERECTOMY     Family History  Problem Relation Age of Onset   Heart  disease Mother    Diabetes Mother    Hyperlipidemia Mother    Cancer Mother        kidney   Heart attack Mother    Heart disease Father    Social History   Socioeconomic History   Marital status: Divorced    Spouse name: Not on file   Number of children: 0   Years of education: 14   Highest education level: Bachelor's degree (e.g., BA, AB, BS)  Occupational History   Occupation: Realtor    Comment: Full time  Tobacco Use   Smoking status: Never   Smokeless tobacco: Never  Vaping Use   Vaping status: Never Used  Substance and Sexual Activity   Alcohol use: No     Alcohol/week: 1.0 standard drink of alcohol    Types: 1 Standard drinks or equivalent per week   Drug use: No   Sexual activity: Not Currently  Other Topics Concern   Not on file  Social History Narrative   Lives alone. Still working full time. She enjoys reading.    Social Drivers of Corporate investment banker Strain: Low Risk  (03/05/2023)   Overall Financial Resource Strain (CARDIA)    Difficulty of Paying Living Expenses: Not hard at all  Food Insecurity: No Food Insecurity (03/05/2023)   Hunger Vital Sign    Worried About Running Out of Food in the Last Year: Never true    Ran Out of Food in the Last Year: Never true  Transportation Needs: No Transportation Needs (03/05/2023)   PRAPARE - Administrator, Civil Service (Medical): No    Lack of Transportation (Non-Medical): No  Physical Activity: Sufficiently Active (03/05/2023)   Exercise Vital Sign    Days of Exercise per Week: 7 days    Minutes of Exercise per Session: 30 min  Stress: No Stress Concern Present (03/05/2023)   [employer] of Occupational Health - Occupational Stress Questionnaire    Feeling of Stress : Not at all  Social Connections: Moderately Integrated (03/05/2023)   Social Connection and Isolation Panel [NHANES]    Frequency of Communication with Friends and Family: More than three times a week    Frequency of Social Gatherings with Friends and Family: More than three times a week    Attends Religious Services: 1 to 4 times per year    Active Member of [employer] or Organizations: Yes    Attends Banker Meetings: 1 to 4 times per year    Marital Status: Divorced    Tobacco Counseling Counseling given: Not Answered   Clinical Intake:  Pre-visit preparation completed: No  Pain : No/denies pain     BMI - recorded: 27.7 Nutritional Risks: None Diabetes: Yes CBG done?: No Did pt. bring in CBG monitor from home?: No  How often do you need to have someone help you  when you read instructions, pamphlets, or other written materials from your doctor or pharmacy?: 1 - Never What is the last grade level you completed in school?: 14  Interpreter Needed?: No      Activities of Daily Living    03/05/2023    9:53 AM  In your present state of health, do you have any difficulty performing the following activities:  Hearing? 0  Vision? 0  Difficulty concentrating or making decisions? 0  Walking or climbing stairs? 0  Dressing or bathing? 0  Doing errands, shopping? 0  Preparing Food and eating ? N  Using the Toilet? N  In the past six months, have you accidently leaked urine? Y  Comment wears pad daily  Do you have problems with loss of bowel control? N  Managing your Medications? N  Managing your Finances? N  Housekeeping or managing your Housekeeping? N    Patient Care Team: Christen Butter, NP as PCP - General (Nurse Practitioner) Twanna Hy, [place], [place]   Indicate any recent Medical Services you may have received from other than [facility] providers in the past year (date may be approximate).     Assessment:   This is a routine wellness examination for [facility].  Hearing/Vision screen Hearing Screening - Comments:: Unable to test, grossly intact.  Vision Screening - Comments:: Unable to test, wears contacts, sees well with contacts.    Goals Addressed             This Visit's Progress    Set My Weight Loss Goal          - set weight loss goal: lose 10 more pounds. Seeing dietician in January.          Depression Screen    03/05/2023   10:01 AM 03/05/2023    9:59 AM 02/23/2023   10:39 AM 02/27/2022   10:02 AM 08/19/2021    2:58 PM 02/23/2021   10:04 AM 02/18/2021    2:07 PM  PHQ 2/9 Scores  PHQ - 2 Score 0 0 0 0 0 0 0    Fall Risk    03/05/2023   10:00 AM 02/23/2023   10:11 AM 02/27/2022   10:01 AM 08/19/2021    2:57 PM 02/23/2021   10:04 AM  Fall Risk   Falls in the past year? 0 0 0 0 0  Number falls in past yr: 0  0 0 0 0  Injury with Fall? 0 0 0 0 0  Risk for fall due to : No Fall Risks No Fall Risks No Fall Risks No Fall Risks No Fall Risks  Follow up  Falls evaluation completed Falls evaluation completed Falls evaluation completed Falls evaluation completed    MEDICARE RISK AT HOME: Medicare Risk at Home Any stairs in or around the home?: Yes If so, are there any without handrails?: Yes Home free of loose throw rugs in walkways, pet beds, electrical cords, etc?: Yes Adequate lighting in your home to reduce risk of falls?: Yes Life alert?: No Use of a cane, walker or w/c?: No Grab bars in the bathroom?: Yes Shower chair or bench in shower?: Yes Elevated toilet seat or a handicapped toilet?: Yes  TIMED UP AND GO:  Was the test performed?  No    Cognitive Function:        03/05/2023   10:01 AM 02/27/2022   10:06 AM 02/23/2021   10:08 AM  6CIT Screen  What Year? 0 points 0 points 0 points  What month? 0 points 0 points 0 points  What time? 0 points 0 points 0 points  Count back from 20 0 points 0 points 0 points  Months in reverse 0 points 0 points 0 points  Repeat phrase 2 points 0 points 0 points  Total Score 2 points 0 points 0 points    Immunizations Immunization History  Administered Date(s) Administered   Influenza, High Dose Seasonal PF 11/08/2018   Influenza,inj,Quad PF,6+ Mos 12/29/2014, 01/05/2016   Influenza-Unspecified 12/11/2012, 11/08/2017, 11/09/2018, 11/11/2020   PFIZER(Purple Top)SARS-COV-2 Vaccination 05/04/2019, 05/28/2019   Pneumococcal Conjugate-13 11/02/2014   Pneumococcal Polysaccharide-23 10/10/2012   Tdap 11/02/2014  Zoster Recombinant(Shingrix) 02/20/2018, 04/21/2018   Zoster, Live 03/14/2007    TDAP status: Up to date  Flu Vaccine status: Declined, Education has been provided regarding the importance of this vaccine but patient still declined. Advised may receive this vaccine at local pharmacy or Health Dept. Aware to provide a copy of the  vaccination record if obtained from local pharmacy or Health Dept. Verbalized acceptance and understanding.  Pneumococcal vaccine status: Up to date  Covid-19 vaccine status: Declined, Education has been provided regarding the importance of this vaccine but patient still declined. Advised may receive this vaccine at local pharmacy or Health Dept.or vaccine clinic. Aware to provide a copy of the vaccination record if obtained from local pharmacy or Health Dept. Verbalized acceptance and understanding.  Qualifies for Shingles Vaccine? Yes   Zostavax completed Yes   Shingrix Completed?: Yes  Screening Tests Health Maintenance  Topic Date Due   Diabetic kidney evaluation - Urine ACR  Never done   INFLUENZA VACCINE  06/11/2023 (Originally 10/12/2022)   MAMMOGRAM  11/30/2023   Diabetic kidney evaluation - eGFR measurement  02/23/2024   Medicare Annual Wellness (AWV)  03/04/2024   DTaP/Tdap/Td (2 - Td or Tdap) 11/01/2024   Pneumonia Vaccine 38+ Years old  Completed   DEXA SCAN  Completed   Hepatitis C Screening  Completed   Zoster Vaccines- Shingrix  Completed   HPV VACCINES  Aged Out   COVID-19 Vaccine  Discontinued   Fecal DNA (Cologuard)  Discontinued    Health Maintenance  Health Maintenance Due  Topic Date Due   Diabetic kidney evaluation - Urine ACR  Never done    Colorectal cancer screening: Type of screening: Colonoscopy. Completed 06/17/20. Repeat every 10 years. Likely will be aged out in 10 years.   1 0/1/24  Bone Density status: Completed 03/15/22. Results reflect: Bone density results: OSTEOPENIA. Repeat every 2 years.  Lung Cancer Screening: (Low Dose CT Chest recommended if Age 16-80 years, 20 pack-year currently smoking OR have quit w/in 15years.) does not qualify.   Lung Cancer Screening Referral: n/a  Additional Screening:  Hepatitis C Screening: does qualify; Completed 11/05/24  Vision Screening: Recommended annual ophthalmology exams for early detection of  glaucoma and other disorders of the eye. Is the patient up to date with their annual eye exam?  Yes  Who is the provider or what is the name of the office in which the patient attends annual eye exams? [facility] in [place], [place]  If pt is not established with a provider, would they like to be referred to a provider to establish care? No .   Dental Screening: Recommended annual dental exams for proper oral hygiene  Diabetic Foot Exam: needs to complete  Community Resource Referral / Chronic Care Management: CRR required this visit?  No   CCM required this visit?  No     Plan:     I have personally reviewed and noted the following in the patient's chart:   Medical and social history Use of alcohol, tobacco or illicit drugs  Current medications and supplements including opioid prescriptions. Patient is not currently taking opioid prescriptions. Functional ability and status Nutritional status Physical activity Advanced directives List of other physicians Hospitalizations, surgeries, and ER visits in previous 12 months Vitals Screenings to include cognitive, depression, and falls Referrals and appointments  In addition, I have reviewed and discussed with patient certain preventive protocols, quality metrics, and best practice recommendations. A written personalized care plan for preventive services as well as  general preventive health recommendations were provided to patient.     Novella Olive, FNP   03/05/2023   After Visit Summary: (MyChart) Due to this being a telephonic visit, the after visit summary with patients personalized plan was offered to patient via MyChart   Follow-up with PCP as scheduled. Needs diabetic kidney evaluation for pre-diabetes. Needs foot exam for pre-diabetes.

## 2023-03-19 ENCOUNTER — Ambulatory Visit: Payer: Medicare Other | Admitting: Nutrition

## 2023-03-21 ENCOUNTER — Ambulatory Visit: Payer: Medicare Other | Admitting: Nutrition

## 2023-04-04 ENCOUNTER — Encounter: Payer: Medicare Other | Attending: Medical-Surgical | Admitting: Nutrition

## 2023-04-04 VITALS — Ht 67.0 in | Wt 174.6 lb

## 2023-04-04 DIAGNOSIS — E119 Type 2 diabetes mellitus without complications: Secondary | ICD-10-CM | POA: Diagnosis not present

## 2023-04-04 NOTE — Progress Notes (Signed)
Hgb A1C increased from 6.1 to 6.5 over the holidays.  Pt. Very upset about this.  Also cholesterol over 200 and triglycerides elevated.  Has changed diet since hearing this:  stopped snacking and says holiday eating did this.   Exercise:  walking for 30-60 minutes 3-4 days/wk, and gym doing strength training 2X/wk X1 hour.   Diabetes medications:  patient prefers to control this diet and exercise.  Taking multiple vitamins and spices: tumeric, garlic etc. SBGM:  none  "my doctor did not recommend this." Diet:  prefers to control her diabetes with diet and exercise.   Typical day (since hearing of elevated cholesterol and blood sugar: 7-8AM: coffee-black, 1 cup cooked oatmeal with water, 1 T honey, blueberries, 1 piece of raisen bread and 12 ounces of skim milk. 11:30: 3-4 ounces of hamburger pattie with rice and water or milk to drink, or peanut butter and small amount of jelly sandwich with milk 3-4PM: fruit-2 small oranges, or large apple 5-6PM: 4-6 ounces of broiled or baked chicken, with veg., one starchy one, or will go to cafeteria and get a veg. Plate of 4 veg. With bread and water to drink 9PM bed.  Denies eating after supper, or during the night. Discussion:  What type II diabetes is, progression of diabetes, and ways to decrease insulin resistance with weight loss, exercise and diet. 3 basic food groups, what foods fall into each group, the need for all three food groups to be in each meal, with limitations of att three food group, and how much should be eaten at each meal. Breakfast has no protein and high in carbs:  Suggestion:  adding nuts to protein and stopping raisin bread and limiting milk to 1 cup. Patient is now consuming 1800 calories since limiting her food intake, and gave her much praise for this.  Discussed that she should be losing 1 pound per week and she agreed that she has lost weight since starting limiting calorie intake. SBGM:  suggested possible testing of blood sugar  2hr. Pc, but patient not receptive to this at this time. Exercise:  gave much praise for current exercise routine and discussed how this will decrease insulin resistance, burn more calories at rest, strengthen muscles and bones, increase HDL,and help with weight control.  Believe overall understanding is good and motivation for needed diet changes in good.  Appointment made for 1 month return visit.

## 2023-04-06 NOTE — Patient Instructions (Signed)
Make sure all meals have protein, small amount of carbohydrate and small amount of fat Continue exercise  Stop raisin bread at breakfast and limit milk to 1 cup with meals. Review handouts given WG:NFAOZ of fats, How to limit saturated fats, etc., and call if questions.

## 2023-05-07 ENCOUNTER — Other Ambulatory Visit: Payer: Self-pay | Admitting: Medical-Surgical

## 2023-05-07 DIAGNOSIS — Z780 Asymptomatic menopausal state: Secondary | ICD-10-CM

## 2023-05-22 ENCOUNTER — Telehealth: Payer: Self-pay | Admitting: Nutrition

## 2023-05-22 NOTE — Telephone Encounter (Signed)
 Pt. Calling to say she has lost 5 pounds, going to the gym 2 days a week and walking 4-5 days/wk.  Has limited her fat and is eating almost no saturated, or trans fats.  She reports feeling better and having more energy.

## 2023-05-30 DIAGNOSIS — D225 Melanocytic nevi of trunk: Secondary | ICD-10-CM | POA: Diagnosis not present

## 2023-05-30 DIAGNOSIS — L814 Other melanin hyperpigmentation: Secondary | ICD-10-CM | POA: Diagnosis not present

## 2023-05-30 DIAGNOSIS — Z85828 Personal history of other malignant neoplasm of skin: Secondary | ICD-10-CM | POA: Diagnosis not present

## 2023-05-30 DIAGNOSIS — L579 Skin changes due to chronic exposure to nonionizing radiation, unspecified: Secondary | ICD-10-CM | POA: Diagnosis not present

## 2023-05-30 DIAGNOSIS — L821 Other seborrheic keratosis: Secondary | ICD-10-CM | POA: Diagnosis not present

## 2023-05-31 ENCOUNTER — Other Ambulatory Visit: Payer: Self-pay | Admitting: Medical-Surgical

## 2023-05-31 DIAGNOSIS — M8580 Other specified disorders of bone density and structure, unspecified site: Secondary | ICD-10-CM

## 2023-05-31 DIAGNOSIS — Z780 Asymptomatic menopausal state: Secondary | ICD-10-CM

## 2023-07-19 ENCOUNTER — Other Ambulatory Visit: Payer: Self-pay | Admitting: Medical-Surgical

## 2023-07-19 DIAGNOSIS — Z780 Asymptomatic menopausal state: Secondary | ICD-10-CM

## 2023-08-12 ENCOUNTER — Other Ambulatory Visit: Payer: Self-pay | Admitting: Medical-Surgical

## 2023-08-12 DIAGNOSIS — M8580 Other specified disorders of bone density and structure, unspecified site: Secondary | ICD-10-CM

## 2023-08-12 DIAGNOSIS — Z780 Asymptomatic menopausal state: Secondary | ICD-10-CM

## 2023-08-24 ENCOUNTER — Ambulatory Visit: Payer: Medicare Other | Admitting: Medical-Surgical

## 2023-09-07 ENCOUNTER — Ambulatory Visit (INDEPENDENT_AMBULATORY_CARE_PROVIDER_SITE_OTHER): Admitting: Medical-Surgical

## 2023-09-07 ENCOUNTER — Encounter: Payer: Self-pay | Admitting: Medical-Surgical

## 2023-09-07 VITALS — BP 123/80 | HR 81 | Resp 20 | Ht 67.0 in | Wt 172.1 lb

## 2023-09-07 DIAGNOSIS — E119 Type 2 diabetes mellitus without complications: Secondary | ICD-10-CM | POA: Insufficient documentation

## 2023-09-07 DIAGNOSIS — E039 Hypothyroidism, unspecified: Secondary | ICD-10-CM

## 2023-09-07 DIAGNOSIS — R7303 Prediabetes: Secondary | ICD-10-CM

## 2023-09-07 LAB — POCT UA - MICROALBUMIN
Albumin/Creatinine Ratio, Urine, POC: <30
Creatinine, POC: 200 mg/dL
Microalbumin Ur, POC: 30 mg/L

## 2023-09-07 LAB — POCT GLYCOSYLATED HEMOGLOBIN (HGB A1C): Hemoglobin A1C: 6 % — AB (ref 4.0–5.6)

## 2023-09-07 NOTE — Progress Notes (Unsigned)
        Established patient visit  History, exam, impression, and plan:  1. Type 2 diabetes mellitus without complication, without long-term current use of insulin (HCC) (Primary) Very pleasant 78 year old female presenting today for follow-up on type 2 diabetes.  She has long history of being prediabetic however approximately 6 months ago, she had an A1c resulted at 6.5 indicating progression to type 2 diabetes.  She has not starting any medication and preferred to work on dietary and lifestyle modifications first.  Today she reports that she has changed her entire diet around.  She was able to attend the diabetic education classes and found that it was extremely beneficial information. - POCT HgB A1C - POCT UA - Microalbumin - HM Diabetes Foot Exam - CMP14+EGFR - Lipid panel  2. Hypothyroidism, unspecified type *** - TSH   Procedures performed this visit: None.  No follow-ups on file.  __________________________________ Zada FREDRIK Palin, DNP, APRN, FNP-BC Primary Care and Sports Medicine [facility]

## 2023-09-08 ENCOUNTER — Ambulatory Visit: Payer: Self-pay | Admitting: Medical-Surgical

## 2023-09-08 LAB — CMP14+EGFR
ALT: 13 IU/L (ref 0–32)
AST: 16 IU/L (ref 0–40)
Albumin: 4.2 g/dL (ref 3.8–4.8)
Alkaline Phosphatase: 131 IU/L — ABNORMAL HIGH (ref 44–121)
BUN/Creatinine Ratio: 18 (ref 12–28)
BUN: 13 mg/dL (ref 8–27)
Bilirubin Total: 0.5 mg/dL (ref 0.0–1.2)
CO2: 21 mmol/L (ref 20–29)
Calcium: 9 mg/dL (ref 8.7–10.3)
Chloride: 101 mmol/L (ref 96–106)
Creatinine, Ser: 0.71 mg/dL (ref 0.57–1.00)
Globulin, Total: 2.5 g/dL (ref 1.5–4.5)
Glucose: 94 mg/dL (ref 70–99)
Potassium: 4.4 mmol/L (ref 3.5–5.2)
Sodium: 138 mmol/L (ref 134–144)
Total Protein: 6.7 g/dL (ref 6.0–8.5)
eGFR: 88 mL/min/{1.73_m2} (ref >59)

## 2023-09-08 LAB — LIPID PANEL
Chol/HDL Ratio: 3.5 ratio (ref 0.0–4.4)
Cholesterol, Total: 199 mg/dL (ref 100–199)
HDL: 57 mg/dL (ref >39)
LDL Chol Calc (NIH): 128 mg/dL — ABNORMAL HIGH (ref 0–99)
Triglycerides: 80 mg/dL (ref 0–149)
VLDL Cholesterol Cal: 14 mg/dL (ref 5–40)

## 2023-09-08 LAB — TSH: TSH: 2.79 u[IU]/mL (ref 0.450–4.500)

## 2023-10-05 NOTE — Telephone Encounter (Signed)
 Patient scheduled.

## 2023-10-08 ENCOUNTER — Encounter: Payer: Self-pay | Admitting: Medical-Surgical

## 2023-10-08 ENCOUNTER — Ambulatory Visit

## 2023-10-08 ENCOUNTER — Ambulatory Visit (INDEPENDENT_AMBULATORY_CARE_PROVIDER_SITE_OTHER): Admitting: Medical-Surgical

## 2023-10-08 VITALS — BP 128/73 | HR 100 | Resp 20 | Ht 67.0 in | Wt 172.0 lb

## 2023-10-08 DIAGNOSIS — R1906 Epigastric swelling, mass or lump: Secondary | ICD-10-CM | POA: Diagnosis not present

## 2023-10-08 DIAGNOSIS — R222 Localized swelling, mass and lump, trunk: Secondary | ICD-10-CM | POA: Diagnosis not present

## 2023-10-08 NOTE — Progress Notes (Signed)
        Established patient visit  History, exam, impression, and plan:  1. Abdominal wall lump (Primary) Pleasant 78 year old female presenting today for evaluation of the abdominal lump that has been in place for approximately 2 years.  Notes that the lump is just at the edge of her ribs and slightly to the left under her breast.  When she stands up, it sinks in and she is able to feel it.  More difficult to find when she is sitting or lying down.  Has not had previous evaluation but would like imaging to help identify what the cause of the lump is.  Has not changed in size recently.  On evaluation, she does have a lump in the epigastric region just to the left of the midline that is firm with clearly identified borders, approximately 1cm x 1cm.  Nontender, difficulty determining mobility.  On palpation, lump feels like it is sitting on the abdominal muscles rather than below them.  Ordering ultrasound for further evaluation. - US  Abdomen Limited; Future   Procedures performed this visit: None.  Return if symptoms worsen or fail to improve.  __________________________________ Zada FREDRIK Palin, DNP, APRN, FNP-BC Primary Care and Sports Medicine [facility]

## 2023-10-15 ENCOUNTER — Ambulatory Visit: Payer: Self-pay | Admitting: Medical-Surgical

## 2023-10-26 ENCOUNTER — Other Ambulatory Visit: Payer: Self-pay | Admitting: Medical-Surgical

## 2023-10-26 DIAGNOSIS — Z1231 Encounter for screening mammogram for malignant neoplasm of breast: Secondary | ICD-10-CM

## 2023-12-12 ENCOUNTER — Other Ambulatory Visit: Payer: Self-pay | Admitting: Medical-Surgical

## 2023-12-14 ENCOUNTER — Ambulatory Visit
Admission: RE | Admit: 2023-12-14 | Discharge: 2023-12-14 | Disposition: A | Discharge status: Home / Self Care | Source: Ambulatory Visit | Attending: Medical-Surgical | Admitting: Medical-Surgical

## 2023-12-14 DIAGNOSIS — Z1231 Encounter for screening mammogram for malignant neoplasm of breast: Secondary | ICD-10-CM

## 2023-12-17 DIAGNOSIS — D3132 Benign neoplasm of left choroid: Secondary | ICD-10-CM | POA: Diagnosis not present

## 2023-12-17 DIAGNOSIS — H2513 Age-related nuclear cataract, bilateral: Secondary | ICD-10-CM | POA: Diagnosis not present

## 2023-12-17 DIAGNOSIS — H02839 Dermatochalasis of unspecified eye, unspecified eyelid: Secondary | ICD-10-CM | POA: Diagnosis not present

## 2023-12-17 DIAGNOSIS — H1045 Other chronic allergic conjunctivitis: Secondary | ICD-10-CM | POA: Diagnosis not present

## 2023-12-19 ENCOUNTER — Ambulatory Visit: Payer: Self-pay | Admitting: Medical-Surgical

## 2023-12-20 MED ORDER — FAMOTIDINE 20 MG PO TABS: 20.0000 mg | ORAL_TABLET | Freq: Two times a day (BID) | ORAL | 0 refills | Status: DC

## 2023-12-20 NOTE — Telephone Encounter (Signed)
 Medication refilled per protocol - patient had requested to send to mail order pharmacy  Sent as 90 day supply until patient can be seen at upcoming appt scheduled on 03/10/2024

## 2024-01-07 ENCOUNTER — Other Ambulatory Visit: Payer: Self-pay | Admitting: Medical-Surgical

## 2024-01-07 DIAGNOSIS — E039 Hypothyroidism, unspecified: Secondary | ICD-10-CM

## 2024-01-31 ENCOUNTER — Encounter: Payer: Self-pay | Admitting: Medical-Surgical

## 2024-01-31 DIAGNOSIS — Z780 Asymptomatic menopausal state: Secondary | ICD-10-CM

## 2024-03-03 ENCOUNTER — Other Ambulatory Visit: Payer: Self-pay | Admitting: Medical-Surgical

## 2024-03-09 ENCOUNTER — Encounter: Payer: Self-pay | Admitting: Medical-Surgical

## 2024-03-09 NOTE — Progress Notes (Unsigned)
 "       Established patient visit   History of Present Illness   Discussed the use of AI scribe software for clinical note transcription with the patient, who gave verbal consent to proceed.  History of Present Illness   [name] is a 78 year old female who presents for a chronic disease follow-up visit.  Glycemic control - A1c has remained stable at 6.0% since June 2025 - Diabetes managed with diet and lifestyle modifications only - No home blood glucose monitoring - Met with dietitian and reduced dietary fats and skim milk intake - Desires further reduction in A1c - Diet not as healthy as desired, especially during holidays  Thyroid  disease - Long-standing thyroid  disease managed with levothyroxine  50 mcg daily - No current symptoms of hypothyroidism or hyperthyroidism  Mental health - Anxiety and depression well controlled with Celexa  40 mg daily - No SI/HI  Allergic rhinitis - Uses Flonase  daily - No current respiratory symptoms aside from allergies  Gastrointestinal health - Pepcid  taken 20 mg twice daily - No abdominal pain - Colonoscopy in April 2022 after abnormal Cologuard revealed benign polyp, repeat in 10 years  Preventive care - Up to date on eye and dental exams      Physical Exam   Physical Exam Vitals reviewed.  Constitutional:      General: She is not in acute distress.    Appearance: Normal appearance. She is not ill-appearing.  HENT:     Head: Normocephalic and atraumatic.     Right Ear: Tympanic membrane, ear canal and external ear normal. There is no impacted cerumen.     Left Ear: Tympanic membrane, ear canal and external ear normal. There is no impacted cerumen.     Nose: Nose normal. No congestion or rhinorrhea.     Mouth/Throat:     Mouth: Mucous membranes are moist.     Pharynx: No oropharyngeal exudate or posterior oropharyngeal erythema.  Eyes:     General: No scleral icterus.       Right eye: No discharge.        Left  eye: No discharge.     Extraocular Movements: Extraocular movements intact.     Conjunctiva/sclera: Conjunctivae normal.     Pupils: Pupils are equal, round, and reactive to light.  Neck:     Thyroid : No thyromegaly.     Vascular: No carotid bruit or JVD.     Trachea: Trachea normal.  Cardiovascular:     Rate and Rhythm: Normal rate and regular rhythm.     Pulses: Normal pulses.     Heart sounds: Normal heart sounds. No murmur heard.    No friction rub. No gallop.  Pulmonary:     Effort: Pulmonary effort is normal. No respiratory distress.     Breath sounds: Normal breath sounds. No wheezing.  Abdominal:     General: Bowel sounds are normal. There is no distension.     Palpations: Abdomen is soft.     Tenderness: There is no abdominal tenderness. There is no guarding.  Musculoskeletal:        General: Normal range of motion.     Cervical back: Normal range of motion and neck supple.  Lymphadenopathy:     Cervical: No cervical adenopathy.  Skin:    General: Skin is warm and dry.  Neurological:     Mental Status: She is alert and oriented to person, place, and time.     Cranial Nerves: No cranial nerve  deficit.  Psychiatric:        Mood and Affect: Mood normal.        Behavior: Behavior normal.        Thought Content: Thought content normal.        Judgment: Judgment normal.    Assessment & Plan     Healthcare maintenance Up to date on screenings. Colonoscopy in 2022 with benign polyp. - Ordered blood work. - Requested eye exam records. - Sent after visit summary via MyChart.  Type 2 diabetes mellitus without complication, without long-term current use of insulin (HCC)  Well-controlled with HbA1c at 6.0%. Managed with diet and lifestyle. Discussed glycemic index and dietary modifications. - Continue current diet and lifestyle modifications. - Sent glycemic index chart via MyChart.  Hyperlipidemia Not currently on statin medication. -Checking lipids  today.  Gastroesophageal reflux disease Managed with Pepcid . No issues reported. - Continue Pepcid  once daily.  Hypothyroidism Managed with levothyroxine . No issues reported. - Continue levothyroxine  50 mcg daily. - Checking TSH today.  Anxiety and depression Well-managed on Celexa  40 mg. No breakthrough symptoms reported. - Continue Celexa  40 mg.     Follow up   Return in about 6 months (around 09/08/2024) for chronic disease follow up. __________________________________ Zada FREDRIK Palin, DNP, APRN, FNP-BC Primary Care and Sports Medicine [facility] "

## 2024-03-10 ENCOUNTER — Encounter: Payer: Self-pay | Admitting: Medical-Surgical

## 2024-03-10 ENCOUNTER — Ambulatory Visit (INDEPENDENT_AMBULATORY_CARE_PROVIDER_SITE_OTHER): Admitting: Medical-Surgical

## 2024-03-10 VITALS — BP 115/73 | HR 87 | Resp 20 | Ht 67.0 in | Wt 165.0 lb

## 2024-03-10 DIAGNOSIS — E782 Mixed hyperlipidemia: Secondary | ICD-10-CM

## 2024-03-10 DIAGNOSIS — Z8659 Personal history of other mental and behavioral disorders: Secondary | ICD-10-CM

## 2024-03-10 DIAGNOSIS — K219 Gastro-esophageal reflux disease without esophagitis: Secondary | ICD-10-CM

## 2024-03-10 DIAGNOSIS — F419 Anxiety disorder, unspecified: Secondary | ICD-10-CM | POA: Diagnosis not present

## 2024-03-10 DIAGNOSIS — E119 Type 2 diabetes mellitus without complications: Secondary | ICD-10-CM | POA: Diagnosis not present

## 2024-03-10 DIAGNOSIS — E039 Hypothyroidism, unspecified: Secondary | ICD-10-CM

## 2024-03-10 LAB — POCT GLYCOSYLATED HEMOGLOBIN (HGB A1C)
HbA1c, POC (controlled diabetic range): 6 % (ref 0.0–7.0)
Hemoglobin A1C: 6 % — AB (ref 4.0–5.6)

## 2024-03-10 MED ORDER — FAMOTIDINE 20 MG PO TABS: 20.0000 mg | ORAL_TABLET | Freq: Two times a day (BID) | ORAL | 3 refills | Status: AC

## 2024-03-11 ENCOUNTER — Ambulatory Visit: Payer: Self-pay | Admitting: Medical-Surgical

## 2024-03-11 LAB — COMPREHENSIVE METABOLIC PANEL WITH GFR
ALT: 11 IU/L (ref 0–32)
AST: 15 IU/L (ref 0–40)
Albumin: 4.1 g/dL (ref 3.8–4.8)
Alkaline Phosphatase: 122 IU/L (ref 49–135)
BUN/Creatinine Ratio: 20 (ref 12–28)
BUN: 14 mg/dL (ref 8–27)
Bilirubin Total: 0.5 mg/dL (ref 0.0–1.2)
CO2: 25 mmol/L (ref 20–29)
Calcium: 9.1 mg/dL (ref 8.7–10.3)
Chloride: 99 mmol/L (ref 96–106)
Creatinine, Ser: 0.71 mg/dL (ref 0.57–1.00)
Globulin, Total: 2.4 g/dL (ref 1.5–4.5)
Glucose: 90 mg/dL (ref 70–99)
Potassium: 4.4 mmol/L (ref 3.5–5.2)
Sodium: 138 mmol/L (ref 134–144)
Total Protein: 6.5 g/dL (ref 6.0–8.5)
eGFR: 88 mL/min/1.73

## 2024-03-11 LAB — CBC WITH DIFFERENTIAL/PLATELET
Basophils Absolute: 0.1 x10E3/uL (ref 0.0–0.2)
Basos: 1 %
EOS (ABSOLUTE): 0.2 x10E3/uL (ref 0.0–0.4)
Eos: 3 %
Hematocrit: 37.2 % (ref 34.0–46.6)
Hemoglobin: 11.9 g/dL (ref 11.1–15.9)
Immature Grans (Abs): 0 x10E3/uL (ref 0.0–0.1)
Immature Granulocytes: 0 %
Lymphocytes Absolute: 1.8 x10E3/uL (ref 0.7–3.1)
Lymphs: 27 %
MCH: 28.4 pg (ref 26.6–33.0)
MCHC: 32 g/dL (ref 31.5–35.7)
MCV: 89 fL (ref 79–97)
Monocytes Absolute: 0.4 x10E3/uL (ref 0.1–0.9)
Monocytes: 6 %
Neutrophils Absolute: 4.2 x10E3/uL (ref 1.4–7.0)
Neutrophils: 63 %
Platelets: 303 x10E3/uL (ref 150–450)
RBC: 4.19 x10E6/uL (ref 3.77–5.28)
RDW: 13.9 % (ref 11.7–15.4)
WBC: 6.7 x10E3/uL (ref 3.4–10.8)

## 2024-03-11 LAB — LIPID PANEL
Chol/HDL Ratio: 3.7 ratio (ref 0.0–4.4)
Cholesterol, Total: 216 mg/dL — ABNORMAL HIGH (ref 100–199)
HDL: 58 mg/dL
LDL Chol Calc (NIH): 138 mg/dL — ABNORMAL HIGH (ref 0–99)
Triglycerides: 112 mg/dL (ref 0–149)
VLDL Cholesterol Cal: 20 mg/dL (ref 5–40)

## 2024-03-11 LAB — TSH: TSH: 2.3 u[IU]/mL (ref 0.450–4.500)

## 2024-03-19 ENCOUNTER — Other Ambulatory Visit: Payer: Self-pay | Admitting: Medical-Surgical

## 2024-03-19 DIAGNOSIS — E039 Hypothyroidism, unspecified: Secondary | ICD-10-CM

## 2024-04-02 ENCOUNTER — Ambulatory Visit

## 2024-04-02 DIAGNOSIS — Z13820 Encounter for screening for osteoporosis: Secondary | ICD-10-CM | POA: Diagnosis not present

## 2024-04-02 DIAGNOSIS — Z780 Asymptomatic menopausal state: Secondary | ICD-10-CM

## 2024-04-03 ENCOUNTER — Ambulatory Visit: Payer: Self-pay | Admitting: Medical-Surgical

## 2024-09-08 ENCOUNTER — Ambulatory Visit: Admitting: Medical-Surgical
